# Patient Record
Sex: Male | Born: 1935 | Race: White | Hispanic: No | Marital: Single | Smoking: Former smoker
Health system: Southern US, Community
[De-identification: ages and names within clinical notes are randomized; demographics above are authoritative.]

## PROBLEM LIST (undated history)

## (undated) DIAGNOSIS — H269 Unspecified cataract: Secondary | ICD-10-CM

## (undated) DIAGNOSIS — M549 Dorsalgia, unspecified: Secondary | ICD-10-CM

## (undated) DIAGNOSIS — R300 Dysuria: Secondary | ICD-10-CM

## (undated) DIAGNOSIS — H9190 Unspecified hearing loss, unspecified ear: Secondary | ICD-10-CM

## (undated) DIAGNOSIS — H3530 Unspecified macular degeneration: Secondary | ICD-10-CM

## (undated) DIAGNOSIS — I10 Essential (primary) hypertension: Secondary | ICD-10-CM

## (undated) DIAGNOSIS — L570 Actinic keratosis: Secondary | ICD-10-CM

## (undated) DIAGNOSIS — M4800 Spinal stenosis, site unspecified: Secondary | ICD-10-CM

## (undated) DIAGNOSIS — IMO0001 Reserved for inherently not codable concepts without codable children: Secondary | ICD-10-CM

## (undated) DIAGNOSIS — I6529 Occlusion and stenosis of unspecified carotid artery: Secondary | ICD-10-CM

## (undated) DIAGNOSIS — T8859XA Other complications of anesthesia, initial encounter: Secondary | ICD-10-CM

## (undated) DIAGNOSIS — E7800 Pure hypercholesterolemia, unspecified: Secondary | ICD-10-CM

## (undated) DIAGNOSIS — T4145XA Adverse effect of unspecified anesthetic, initial encounter: Secondary | ICD-10-CM

## (undated) DIAGNOSIS — K219 Gastro-esophageal reflux disease without esophagitis: Secondary | ICD-10-CM

## (undated) DIAGNOSIS — C801 Malignant (primary) neoplasm, unspecified: Secondary | ICD-10-CM

## (undated) HISTORY — DX: Malignant (primary) neoplasm, unspecified: C80.1

## (undated) HISTORY — DX: Dysuria: R30.0

## (undated) HISTORY — PX: CHOLECYSTECTOMY: SHX55

## (undated) HISTORY — DX: Actinic keratosis: L57.0

## (undated) HISTORY — PX: APPENDECTOMY: SHX54

---

## 2000-12-26 ENCOUNTER — Encounter: Admission: RE | Admit: 2000-12-26 | Discharge: 2000-12-26 | Payer: Self-pay | Admitting: *Deleted

## 2000-12-26 ENCOUNTER — Encounter: Payer: Self-pay | Admitting: *Deleted

## 2001-02-02 ENCOUNTER — Ambulatory Visit (HOSPITAL_COMMUNITY): Admission: RE | Admit: 2001-02-02 | Discharge: 2001-02-02 | Payer: Self-pay | Admitting: Gastroenterology

## 2001-02-19 ENCOUNTER — Encounter: Payer: Self-pay | Admitting: Gastroenterology

## 2001-02-19 ENCOUNTER — Ambulatory Visit (HOSPITAL_COMMUNITY): Admission: RE | Admit: 2001-02-19 | Discharge: 2001-02-19 | Payer: Self-pay | Admitting: Gastroenterology

## 2001-11-24 ENCOUNTER — Ambulatory Visit (HOSPITAL_COMMUNITY): Admission: RE | Admit: 2001-11-24 | Discharge: 2001-11-24 | Payer: Self-pay | Admitting: Gastroenterology

## 2001-11-24 ENCOUNTER — Encounter: Payer: Self-pay | Admitting: Gastroenterology

## 2002-03-03 ENCOUNTER — Encounter: Payer: Self-pay | Admitting: *Deleted

## 2002-03-03 ENCOUNTER — Ambulatory Visit (HOSPITAL_COMMUNITY): Admission: RE | Admit: 2002-03-03 | Discharge: 2002-03-03 | Payer: Self-pay | Admitting: *Deleted

## 2003-08-29 ENCOUNTER — Encounter: Admission: RE | Admit: 2003-08-29 | Discharge: 2003-08-29 | Payer: Self-pay | Admitting: Gastroenterology

## 2005-01-08 ENCOUNTER — Ambulatory Visit (HOSPITAL_COMMUNITY): Admission: RE | Admit: 2005-01-08 | Discharge: 2005-01-08 | Payer: Self-pay | Admitting: Gastroenterology

## 2005-01-31 ENCOUNTER — Encounter: Payer: Self-pay | Admitting: Emergency Medicine

## 2005-02-01 ENCOUNTER — Inpatient Hospital Stay (HOSPITAL_COMMUNITY): Admission: EM | Admit: 2005-02-01 | Discharge: 2005-02-02 | Payer: Self-pay | Admitting: Internal Medicine

## 2010-02-25 ENCOUNTER — Encounter: Payer: Self-pay | Admitting: Gastroenterology

## 2010-11-21 DIAGNOSIS — H269 Unspecified cataract: Secondary | ICD-10-CM | POA: Insufficient documentation

## 2011-01-25 ENCOUNTER — Emergency Department (HOSPITAL_COMMUNITY): Payer: Medicare Other

## 2011-01-25 ENCOUNTER — Inpatient Hospital Stay (HOSPITAL_COMMUNITY)
Admission: EM | Admit: 2011-01-25 | Discharge: 2011-01-26 | DRG: 149 | Disposition: A | Discharge status: Home / Self Care | Payer: Medicare Other | Attending: Internal Medicine | Admitting: Internal Medicine

## 2011-01-25 ENCOUNTER — Encounter: Payer: Self-pay | Admitting: *Deleted

## 2011-01-25 DIAGNOSIS — R42 Dizziness and giddiness: Principal | ICD-10-CM | POA: Diagnosis present

## 2011-01-25 DIAGNOSIS — I10 Essential (primary) hypertension: Secondary | ICD-10-CM | POA: Diagnosis present

## 2011-01-25 DIAGNOSIS — E785 Hyperlipidemia, unspecified: Secondary | ICD-10-CM | POA: Diagnosis present

## 2011-01-25 DIAGNOSIS — R110 Nausea: Secondary | ICD-10-CM | POA: Diagnosis present

## 2011-01-25 DIAGNOSIS — I6529 Occlusion and stenosis of unspecified carotid artery: Secondary | ICD-10-CM | POA: Diagnosis present

## 2011-01-25 DIAGNOSIS — I951 Orthostatic hypotension: Secondary | ICD-10-CM

## 2011-01-25 DIAGNOSIS — M4800 Spinal stenosis, site unspecified: Secondary | ICD-10-CM | POA: Diagnosis present

## 2011-01-25 DIAGNOSIS — I6350 Cerebral infarction due to unspecified occlusion or stenosis of unspecified cerebral artery: Secondary | ICD-10-CM

## 2011-01-25 DIAGNOSIS — I658 Occlusion and stenosis of other precerebral arteries: Secondary | ICD-10-CM | POA: Diagnosis present

## 2011-01-25 DIAGNOSIS — K219 Gastro-esophageal reflux disease without esophagitis: Secondary | ICD-10-CM | POA: Diagnosis present

## 2011-01-25 HISTORY — DX: Pure hypercholesterolemia, unspecified: E78.00

## 2011-01-25 HISTORY — DX: Essential (primary) hypertension: I10

## 2011-01-25 HISTORY — DX: Spinal stenosis, site unspecified: M48.00

## 2011-01-25 LAB — URINALYSIS, ROUTINE W REFLEX MICROSCOPIC
Glucose, UA: NEGATIVE mg/dL
Hgb urine dipstick: NEGATIVE
Ketones, ur: NEGATIVE mg/dL
Leukocytes, UA: NEGATIVE
Protein, ur: NEGATIVE mg/dL
pH: 7 (ref 5.0–8.0)

## 2011-01-25 LAB — CARDIAC PANEL(CRET KIN+CKTOT+MB+TROPI)
CK, MB: 3.7 ng/mL (ref 0.3–4.0)
Relative Index: 3.4 — ABNORMAL HIGH (ref 0.0–2.5)
Total CK: 109 U/L (ref 7–232)
Troponin I: <0.3 ng/mL (ref <0.30)

## 2011-01-25 LAB — CBC
MCHC: 34.2 g/dL (ref 30.0–36.0)
Platelets: 190 10*3/uL (ref 150–400)
RDW: 13 % (ref 11.5–15.5)
WBC: 4.8 10*3/uL (ref 4.0–10.5)

## 2011-01-25 LAB — COMPREHENSIVE METABOLIC PANEL
ALT: 15 U/L (ref 0–53)
AST: 23 U/L (ref 0–37)
Albumin: 4.2 g/dL (ref 3.5–5.2)
CO2: 27 mEq/L (ref 19–32)
Chloride: 97 mEq/L (ref 96–112)
GFR calc non Af Amer: 67 mL/min — ABNORMAL LOW (ref >90)
Sodium: 134 mEq/L — ABNORMAL LOW (ref 135–145)
Total Bilirubin: 0.4 mg/dL (ref 0.3–1.2)

## 2011-01-25 LAB — DIFFERENTIAL
Basophils Absolute: 0 10*3/uL (ref 0.0–0.1)
Basophils Relative: 1 % (ref 0–1)
Lymphocytes Relative: 24 % (ref 12–46)
Neutro Abs: 3.2 10*3/uL (ref 1.7–7.7)
Neutrophils Relative %: 66 % (ref 43–77)

## 2011-01-25 LAB — PROTIME-INR: Prothrombin Time: 13.4 seconds (ref 11.6–15.2)

## 2011-01-25 LAB — VITAMIN B12: Vitamin B-12: 430 pg/mL (ref 211–911)

## 2011-01-25 MED ORDER — SENNOSIDES-DOCUSATE SODIUM 8.6-50 MG PO TABS
1.0000 | ORAL_TABLET | Freq: Every evening | ORAL | Status: DC | PRN
  Filled 2011-01-25: qty 1

## 2011-01-25 MED ORDER — ACETAMINOPHEN 650 MG RE SUPP: 650.0000 mg | RECTAL | Status: DC | PRN

## 2011-01-25 MED ORDER — ASPIRIN 300 MG RE SUPP
300.0000 mg | Freq: Every day | RECTAL | Status: DC
  Filled 2011-01-25: qty 1

## 2011-01-25 MED ORDER — SIMVASTATIN 10 MG PO TABS
10.0000 mg | ORAL_TABLET | Freq: Every day | ORAL | Status: DC
  Filled 2011-01-25 (×3): qty 1

## 2011-01-25 MED ORDER — LISINOPRIL 10 MG PO TABS
10.0000 mg | ORAL_TABLET | Freq: Every day | ORAL | Status: DC
  Administered 2011-01-26: 10 mg via ORAL
  Filled 2011-01-25 (×3): qty 1

## 2011-01-25 MED ORDER — PANTOPRAZOLE SODIUM 40 MG PO TBEC
40.0000 mg | DELAYED_RELEASE_TABLET | Freq: Every day | ORAL | Status: DC
  Administered 2011-01-25: 40 mg via ORAL
  Filled 2011-01-25 (×2): qty 1

## 2011-01-25 MED ORDER — ACETAMINOPHEN 325 MG PO TABS
650.0000 mg | ORAL_TABLET | ORAL | Status: DC | PRN
  Administered 2011-01-25 – 2011-01-26 (×2): 650 mg via ORAL
  Filled 2011-01-25 (×2): qty 2

## 2011-01-25 MED ORDER — ONDANSETRON HCL 4 MG/2ML IJ SOLN
4.0000 mg | Freq: Four times a day (QID) | INTRAMUSCULAR | Status: DC | PRN
  Administered 2011-01-25: 4 mg via INTRAVENOUS
  Filled 2011-01-25: qty 2

## 2011-01-25 MED ORDER — ASPIRIN 325 MG PO TABS
325.0000 mg | ORAL_TABLET | Freq: Every day | ORAL | Status: DC
  Administered 2011-01-25 – 2011-01-26 (×2): 325 mg via ORAL
  Filled 2011-01-25 (×2): qty 1

## 2011-01-25 MED ORDER — LOVASTATIN ER 20 MG PO TB24: 20.0000 mg | ORAL_TABLET | Freq: Every day | ORAL | Status: DC

## 2011-01-25 MED ORDER — ONDANSETRON HCL 4 MG/2ML IJ SOLN
4.0000 mg | Freq: Once | INTRAMUSCULAR | Status: AC
  Administered 2011-01-25: 4 mg via INTRAVENOUS
  Filled 2011-01-25: qty 2

## 2011-01-25 MED ORDER — ENOXAPARIN SODIUM 40 MG/0.4ML ~~LOC~~ SOLN
40.0000 mg | SUBCUTANEOUS | Status: DC
  Administered 2011-01-25: 40 mg via SUBCUTANEOUS
  Filled 2011-01-25 (×2): qty 0.4

## 2011-01-25 MED ORDER — SODIUM CHLORIDE 0.9 % IV SOLN: INTRAVENOUS | Status: DC

## 2011-01-25 MED ORDER — SODIUM CHLORIDE 0.9 % IV BOLUS (SEPSIS)
1000.0000 mL | Freq: Once | INTRAVENOUS | Status: AC
  Administered 2011-01-25: 1000 mL via INTRAVENOUS

## 2011-01-25 MED ORDER — SODIUM CHLORIDE 0.9 % IV SOLN
INTRAVENOUS | Status: DC
  Administered 2011-01-25 (×2): via INTRAVENOUS

## 2011-01-25 MED ORDER — SODIUM CHLORIDE 0.9 % IV BOLUS (SEPSIS)
500.0000 mL | Freq: Once | INTRAVENOUS | Status: AC
  Administered 2011-01-25: 500 mL via INTRAVENOUS

## 2011-01-25 MED ORDER — ONDANSETRON HCL 4 MG/2ML IJ SOLN: 4.0000 mg | Freq: Three times a day (TID) | INTRAMUSCULAR | Status: DC | PRN

## 2011-01-25 NOTE — ED Notes (Signed)
Per EMS, pt from home, reports nausea but denies vomiting.  Pt also c/o R leg pain and was given tramadol x 2 yesterday, last dose was lat night.  Has not eaten since 1830.  Started to feel bad this am while walking his dog, became dizzy with nausea.

## 2011-01-25 NOTE — Progress Notes (Signed)
*  PRELIMINARY RESULTS* Carotid duplex completed. Right - Greater than 80% ICA stenosis in the proximal region. Left there is a 40% to 59% ICA stenosis by velocities. Plaque morphology does not support the increase may be due to the stenosis of the contralateral side. Bilateral ECA stenosis. Vertebral arteries are patent and flow is antegrade.  Dominique Ressel, [place] D 01/25/2011, 4:09 PM

## 2011-01-25 NOTE — ED Notes (Signed)
ZOX:[id number]<AV> Expected date:<BR> Expected time:<BR> Means of arrival:Ambulance<BR> Comments:<BR> Dizziness/nausea

## 2011-01-25 NOTE — ED Notes (Signed)
MD at bedside.[facility]   

## 2011-01-25 NOTE — Progress Notes (Signed)
SLP Cancellation Note  Orders received for evaluation.  Pt passed RN swallow screen, and is tolerating po intake.  Please reconsult if needs arise.  Thank you for this referral! Celia B. Bueche, MSP, CCC-SLP [phone]

## 2011-01-25 NOTE — ED Notes (Addendum)
Patient transported to CT 

## 2011-01-25 NOTE — ED Notes (Signed)
Report called to 4 Tressa Busman RN

## 2011-01-25 NOTE — ED Notes (Signed)
Pt's friend Eustaquio Boyden contacted per pt's request.  Pt also spoke to him.  Pt's friend is at bedside.  Pt also requested to have his son contacted, this nurse called and left a message.

## 2011-01-25 NOTE — ED Notes (Signed)
Swallow screen documented that it was completed by mistake

## 2011-01-25 NOTE — ED Provider Notes (Signed)
History     CSN: [id number]  Arrival date & time 01/25/11  4098   First MD Initiated Contact with Patient 01/25/11 [phone]      Chief Complaint  Patient presents with  . Nausea    (Consider location/radiation/quality/duration/timing/severity/associated sxs/prior treatment) HPI Comments: Patient has by EMS with nausea, dizziness and dry heaving started last night. He was recently started on tramadol for spinal stenosis and symptoms started shortly after that. He is a difficult time describing the dizziness but says he feels like he is going to pass out and was unable to walk his dog this morning. He denies any chest pain, shortness of breath, cough or fever.  He said several episodes of vomiting since arrival. He denies any vertiginous type symptoms.  His dizziness is worse with position change.  Prior to last night he has not been ill and states he been eating well.  No abdominal pain.  The history is provided by the patient.    Past Medical History  Diagnosis Date  . Hypertension   . Hypercholesteremia   . Spinal stenosis     Past Surgical History  Procedure Date  . Cholecystectomy approx. 1992    History reviewed. No pertinent family history.  History  Substance Use Topics  . Smoking status: Never Smoker   . Smokeless tobacco: Never Used  . Alcohol Use: 0.6 oz/week    1 Glasses of wine per week     1 glass wine with evening meal      Review of Systems  Constitutional: Positive for activity change, appetite change and fatigue.  HENT: Negative for rhinorrhea.   Eyes: Negative for visual disturbance.  Respiratory: Negative for chest tightness and shortness of breath.   Cardiovascular: Negative for chest pain.  Gastrointestinal: Positive for nausea and vomiting. Negative for abdominal pain.  Genitourinary: Negative for dysuria and hematuria.  Musculoskeletal: Negative for back pain.  Skin: Negative for rash.  Neurological: Positive for dizziness, weakness and  light-headedness. Negative for headaches.    Allergies  Review of patient's allergies indicates no known allergies.  Home Medications   Current Outpatient Rx  Name Route Sig Dispense Refill  . CALCIUM 600 + D PO Oral Take 1 tablet by mouth daily.      Marland Kitchen LISINOPRIL 10 MG PO TABS Oral Take 10 mg by mouth daily.      Marland Kitchen LOVASTATIN ER 20 MG PO TB24 Oral Take 20 mg by mouth at bedtime.      . OMEPRAZOLE 20 MG PO CPDR Oral Take 20 mg by mouth daily.      . TRAMADOL HCL 50 MG PO TBDP Oral Take 50-100 mg by mouth every 6 (six) hours as needed. pain     . VITAMIN E 400 UNITS PO CAPS Oral Take 400 Units by mouth daily.        BP 141/98  Pulse 66  Temp(Src) 98.2 F (36.8 C) (Oral)  Resp 15  SpO2 96%  Physical Exam  Constitutional: He is oriented to person, place, and time. He appears well-developed and well-nourished. No distress.  HENT:  Head: Normocephalic and atraumatic.  Mouth/Throat: Oropharynx is clear and moist. No oropharyngeal exudate.  Eyes: Conjunctivae and EOM are normal. Pupils are equal, round, and reactive to light.  Neck: Normal range of motion.  Cardiovascular: Normal rate, regular rhythm and normal heart sounds.        bradycardic  Pulmonary/Chest: Effort normal and breath sounds normal. No respiratory distress.  Abdominal: Soft. There is  no tenderness. There is no rebound and no guarding.  Musculoskeletal: Normal range of motion. He exhibits no edema and no tenderness.  Neurological: He is alert and oriented to person, place, and time. No cranial nerve deficit.       No facial droop, 5/5 strength throughout, mild ataxia on finger to nose.  No nystagmus, test of skew negative  Skin: Skin is warm.    ED Course  Procedures (including critical care time)  Labs Reviewed  CARDIAC PANEL(CRET KIN+CKTOT+MB+TROPI) - Abnormal; Notable for the following:    Relative Index 3.4 (*)    All other components within normal limits  CBC - Abnormal; Notable for the following:     Hemoglobin 12.7 (*)    HCT 37.1 (*)    All other components within normal limits  COMPREHENSIVE METABOLIC PANEL - Abnormal; Notable for the following:    Sodium 134 (*)    Glucose, Bld 136 (*)    GFR calc non Af Amer 67 (*)    GFR calc Af Amer 77 (*)    All other components within normal limits  DIFFERENTIAL  LIPASE, BLOOD  URINALYSIS, ROUTINE W REFLEX MICROSCOPIC  PROTIME-INR  TSH  VITAMIN B12  RPR   Dg Chest 2 View  01/25/2011  *RADIOLOGY REPORT*  Clinical Data: Dizziness, nausea and vomiting  CHEST - 2 VIEW  Comparison: None.  Findings: The lungs are clear but hyperaerated consistent with COPD.  Mediastinal contours appear normal.  The heart is mildly enlarged.  There are degenerative changes throughout the thoracic spine and the bones are somewhat osteopenic.  IMPRESSION: COPD.  No active lung disease.  Mild cardiomegaly.  Original Report Authenticated By: Juline Patch, M.D.   Ct Head Wo Contrast  01/25/2011  *RADIOLOGY REPORT*  Clinical Data: 77 year old male with dizziness, nausea.  CT HEAD WITHOUT CONTRAST  Technique:  Contiguous axial images were obtained from the base of the skull through the vertex without contrast.  Comparison: None.  Findings: Visualized paranasal sinuses and mastoids are clear. Visualized orbit soft tissues are within normal limits.  Visualized scalp soft tissues are within normal limits.  Are pass bones  Mild hypodensity in the right basal ganglia/deep white matter capsules.  Otherwise normal gray-white matter differentiation throughout the brain.  No ventriculomegaly.  Dural calcifications. No acute intracranial hemorrhage identified.  No evidence of cortically based acute infarction identified.  Calcified atherosclerosis at the skull base.  No suspicious intracranial vascular hyperdensity. No midline shift, mass effect, or evidence of mass lesion.  IMPRESSION: Largely unremarkable for age noncontrast CT appearance of the brain.  No acute intracranial  abnormality.  Original Report Authenticated By: Ulla Potash III, M.D.     1. Orthostatic hypotension   2. Nausea       MDM  Nausea with lightheadedness, vomiting.  Nonfocal neurological exam.  Patient's symptoms may very well be related to his recent introduction of tramadol.  Differential also includes benign positional vertigo, posterior circulation stroke, ACS.  EKG sinus brady without block, enzymes negative.  Positive orthostatics.  Plan admit patient for IV hydration, further workup of his dizziness and presyncope including serial troponins, MRI, carotid Dopplers echocardiogram     Date: 01/25/2011  Rate: 46  Rhythm: sinus bradycardia  QRS Axis: normal  Intervals: QT prolonged  ST/T Wave abnormalities: nonspecific ST/T changes  Conduction Disutrbances:left bundle branch block  Narrative Interpretation: PVCs  Old EKG Reviewed: unchanged      Glynn Octave, MD 01/25/11 1610

## 2011-01-25 NOTE — ED Notes (Signed)
Pt reports dizziness with nausea that started last night, pt reports the dizziness became severe when he was walking his dog this am.  Pt reports starrting tramadol yesterday for his spinal stenosis and leg pain.  Pt is A&Ox 4.  Pt denies any cp or SOB at present.  No vomiting noted-but was dry heaving after moving from the EMS stretcher to the regular stretcher.  Pt is bradycardic-Dr. Rancour aware.  No neuro deficits noted at this itme.

## 2011-01-25 NOTE — ED Notes (Signed)
Patient transported to MRI 

## 2011-01-25 NOTE — H&P (Signed)
PCP:   No primary provider on file.   Chief Complaint:  Dizziness  HPI: This is a 77 y/o gentleman who presents to the ED with dizziness.  Patient noted onset of his symptoms last night.  He went to bed and when he woke up, his dizziness was still present.  He proceeded to take his dog for a walk, but he reported that his dizziness was getting worse, so he presented to the ED for evaluation.  Of note, he has a history of spinal stenosis, and started taking tramadol yesterday.  He feels that his symptoms may be related to the tramadol.  Currently he reports still having continued dizziness and he is unable to ambulate.  He has also had some nausea but no vomiting.  He was seen in the ED, where work up thus far has been negative.  Orthostatics checked and found to be unrevealing.  CT head negative.  Telemetry showed a heart rate that was initially in the 40s but now in the 60s to 70s.  Patient has been referred for admission.  Allergies:  No Known Allergies    Past Medical History  Diagnosis Date  . Hypertension   . Hypercholesteremia   . Spinal stenosis     Past Surgical History  Procedure Date  . Cholecystectomy approx. 1992    Prior to Admission medications   Medication Sig Start Date End Date Taking? Authorizing Provider  Calcium Carbonate-Vitamin D (CALCIUM 600 + D PO) Take 1 tablet by mouth daily.      Historical Provider, MD  lisinopril (PRINIVIL,ZESTRIL) 10 MG tablet Take 10 mg by mouth daily.      Historical Provider, MD  lovastatin (ALTOPREV) 20 MG 24 hr tablet Take 20 mg by mouth at bedtime.      Historical Provider, MD  omeprazole (PRILOSEC) 20 MG capsule Take 20 mg by mouth daily.      Historical Provider, MD  TraMADol HCl 50 MG TBDP Take 50-100 mg by mouth every 6 (six) hours as needed. pain     Historical Provider, MD  vitamin E 400 UNIT capsule Take 400 Units by mouth daily.      Historical Provider, MD    Social History:  reports that he has never smoked. He has  never used smokeless tobacco. He reports that he drinks about .6 ounces of alcohol per week. He reports that he does not use illicit drugs.  History reviewed. No pertinent family history.  Review of Systems: Positives are in bold Constitutional: Denies fever, chills, diaphoresis, appetite change and fatigue.  HEENT: Denies photophobia, eye pain, redness, hearing loss, ear pain, congestion, sore throat, rhinorrhea, sneezing, mouth sores, trouble swallowing, neck pain, neck stiffness and tinnitus.   Respiratory: Denies SOB, DOE, cough, chest tightness,  and wheezing.   Cardiovascular: Denies chest pain, palpitations and leg swelling.  Gastrointestinal: Denies nausea, vomiting, abdominal pain, diarrhea, constipation, blood in stool and abdominal distention.  Genitourinary: Denies dysuria, urgency, frequency, hematuria, flank pain and difficulty urinating.  Musculoskeletal: Denies myalgias, back pain, joint swelling, arthralgias and gait problem.  Skin: Denies pallor, rash and wound.  Neurological: Denies dizziness, seizures, syncope, weakness, light-headedness, numbness and headaches.  Hematological: Denies adenopathy. Easy bruising, personal or family bleeding history  Psychiatric/Behavioral: Denies suicidal ideation, mood changes, confusion, nervousness, sleep disturbance and agitation   Physical Exam: Blood pressure 141/98, pulse 66, temperature 98.7 F (37.1 C), temperature source Oral, resp. rate 15, SpO2 96.00%. GEN: NAD HEENT: [place], AT, PERRLA, MMM NECK: supple CHEST:  CTA B CVS: S1, S@, RRR ABD: soft, NT, ND, BS+ EXT: no edema NEURO: Strength 5/5 in LLE, 4/5 in RLE (chronic due to spinal stenosis), 5/5 in UE b/l, CN grossly intact  Labs on Admission:  Results for orders placed during the hospital encounter of 01/25/11 (from the past 48 hour(s))  CARDIAC PANEL(CRET KIN+CKTOT+MB+TROPI)     Status: Abnormal   Collection Time   01/25/11  8:45 AM      Component Value Range Comment    Total CK 109  7 - 232 (U/L)    CK, MB 3.7  0.3 - 4.0 (ng/mL)    Troponin I <0.30  <0.30 (ng/mL)    Relative Index 3.4 (*) 0.0 - 2.5    CBC     Status: Abnormal   Collection Time   01/25/11  8:45 AM      Component Value Range Comment   WBC 4.8  4.0 - 10.5 (K/uL)    RBC 4.27  4.22 - 5.81 (MIL/uL)    Hemoglobin 12.7 (*) 13.0 - 17.0 (g/dL)    HCT 40.9 (*) 81.1 - 52.0 (%)    MCV 86.9  78.0 - 100.0 (fL)    MCH 29.7  26.0 - 34.0 (pg)    MCHC 34.2  30.0 - 36.0 (g/dL)    RDW 91.4  78.2 - 95.6 (%)    Platelets 190  150 - 400 (K/uL)   DIFFERENTIAL     Status: Normal   Collection Time   01/25/11  8:45 AM      Component Value Range Comment   Neutrophils Relative 66  43 - 77 (%)    Neutro Abs 3.2  1.7 - 7.7 (K/uL)    Lymphocytes Relative 24  12 - 46 (%)    Lymphs Abs 1.2  0.7 - 4.0 (K/uL)    Monocytes Relative 8  3 - 12 (%)    Monocytes Absolute 0.4  0.1 - 1.0 (K/uL)    Eosinophils Relative 1  0 - 5 (%)    Eosinophils Absolute 0.1  0.0 - 0.7 (K/uL)    Basophils Relative 1  0 - 1 (%)    Basophils Absolute 0.0  0.0 - 0.1 (K/uL)   COMPREHENSIVE METABOLIC PANEL     Status: Abnormal   Collection Time   01/25/11  8:45 AM      Component Value Range Comment   Sodium 134 (*) 135 - 145 (mEq/L)    Potassium 4.5  3.5 - 5.1 (mEq/L)    Chloride 97  96 - 112 (mEq/L)    CO2 27  19 - 32 (mEq/L)    Glucose, Bld 136 (*) 70 - 99 (mg/dL)    BUN 17  6 - 23 (mg/dL)    Creatinine, Ser 2.13  0.50 - 1.35 (mg/dL)    Calcium 08.6  8.4 - 10.5 (mg/dL)    Total Protein 7.1  6.0 - 8.3 (g/dL)    Albumin 4.2  3.5 - 5.2 (g/dL)    AST 23  0 - 37 (U/L)    ALT 15  0 - 53 (U/L)    Alkaline Phosphatase 77  39 - 117 (U/L)    Total Bilirubin 0.4  0.3 - 1.2 (mg/dL)    GFR calc non Af Amer 67 (*) >90 (mL/min)    GFR calc Af Amer 77 (*) >90 (mL/min)   LIPASE, BLOOD     Status: Normal   Collection Time   01/25/11  8:45 AM  Component Value Range Comment   Lipase 41  11 - 59 (U/L)   PROTIME-INR     Status: Normal    Collection Time   01/25/11  8:45 AM      Component Value Range Comment   Prothrombin Time 13.4  11.6 - 15.2 (seconds)    INR 1.00  0.00 - 1.49      Radiological Exams on Admission: Dg Chest 2 View  01/25/2011  *RADIOLOGY REPORT*  Clinical Data: Dizziness, nausea and vomiting  CHEST - 2 VIEW  Comparison: None.  Findings: The lungs are clear but hyperaerated consistent with COPD.  Mediastinal contours appear normal.  The heart is mildly enlarged.  There are degenerative changes throughout the thoracic spine and the bones are somewhat osteopenic.  IMPRESSION: COPD.  No active lung disease.  Mild cardiomegaly.  Original Report Authenticated By: Juline Patch, M.D.   Ct Head Wo Contrast  01/25/2011  *RADIOLOGY REPORT*  Clinical Data: 77 year old male with dizziness, nausea.  CT HEAD WITHOUT CONTRAST  Technique:  Contiguous axial images were obtained from the base of the skull through the vertex without contrast.  Comparison: None.  Findings: Visualized paranasal sinuses and mastoids are clear. Visualized orbit soft tissues are within normal limits.  Visualized scalp soft tissues are within normal limits.  Are pass bones  Mild hypodensity in the right basal ganglia/deep white matter capsules.  Otherwise normal gray-white matter differentiation throughout the brain.  No ventriculomegaly.  Dural calcifications. No acute intracranial hemorrhage identified.  No evidence of cortically based acute infarction identified.  Calcified atherosclerosis at the skull base.  No suspicious intracranial vascular hyperdensity. No midline shift, mass effect, or evidence of mass lesion.  IMPRESSION: Largely unremarkable for age noncontrast CT appearance of the brain.  No acute intracranial abnormality.  Original Report Authenticated By: Harley Hallmark, M.D.    Assessment/Plan Principal Problem:  *Vertigo.  This may very well be caused by tramadol.  Other possibilities could be benign positional vertigo.  Of course with his  age, there is always a concern for a posterior circulation CVA.  We will admit to telemetry.  Give aspirin.  Check MRI/MRA brain, 2D echo, carotid dopplers.  Ask PT to see for gait training, hold tramadol and observe.  Will also check TSH, RPR, B12 Active Problems:  We will continue the patient's outpatient medications for his chronic medical issues.  HTN (hypertension)  Spinal stenosis  GERD (gastroesophageal reflux disease)  Hyperlipidemia  Nausea, antiemetics  DNR, confirmed with patient.  Time Spent on Admission:  Chrystel Barefield [facility]  01/25/2011, 12:35 PM

## 2011-01-26 DIAGNOSIS — I6529 Occlusion and stenosis of unspecified carotid artery: Secondary | ICD-10-CM | POA: Diagnosis present

## 2011-01-26 LAB — RPR: RPR Ser Ql: NONREACTIVE

## 2011-01-26 LAB — HEMOGLOBIN A1C
Hgb A1c MFr Bld: 5.8 % — ABNORMAL HIGH (ref <5.7)
Mean Plasma Glucose: 120 mg/dL — ABNORMAL HIGH (ref <117)

## 2011-01-26 LAB — LIPID PANEL
Cholesterol: 154 mg/dL (ref 0–200)
Triglycerides: 57 mg/dL (ref <150)

## 2011-01-26 MED ORDER — ASPIRIN 325 MG PO TABS: 325.0000 mg | ORAL_TABLET | Freq: Every day | ORAL | Status: DC

## 2011-01-26 NOTE — Discharge Summary (Signed)
Physician Discharge Summary  Patient ID: Thomas Mccarty MRN: [id number] DOB/AGE: February 12, 1925 77 y.o.  Admit date: 01/25/2011 Discharge date: 01/26/2011  Primary Care Physician:  Thomas Boys, MD   Discharge Diagnoses:    Principal Problem:  *Vertigo Active Problems:  HTN (hypertension)  Spinal stenosis  GERD (gastroesophageal reflux disease)  Hyperlipidemia  Carotid stenosis    Current Discharge Medication List    START taking these medications   Details  aspirin 325 MG tablet Take 1 tablet (325 mg total) by mouth daily. Qty: 30 tablet, Refills: 1      CONTINUE these medications which have NOT CHANGED   Details  Calcium Carbonate-Vitamin D (CALCIUM 600 + D PO) Take 1 tablet by mouth daily.      lisinopril (PRINIVIL,ZESTRIL) 10 MG tablet Take 10 mg by mouth daily.      lovastatin (ALTOPREV) 20 MG 24 hr tablet Take 20 mg by mouth at bedtime.      omeprazole (PRILOSEC) 20 MG capsule Take 20 mg by mouth daily.      vitamin E 400 UNIT capsule Take 400 Units by mouth daily.        STOP taking these medications     TraMADol HCl 50 MG TBDP        Discharge Exam: Blood pressure 157/69, pulse 51, temperature 97.7 F (36.5 C), temperature source Oral, resp. rate 16, height 6\' 2"  (1.88 m), weight 71.396 kg (157 lb 6.4 oz), SpO2 94.00%. NAD CTA B S1, S2 RRR Soft, NT, ND, BS+ No edema b/l  Disposition and Follow-up:  Follow up with Primary doctor in 1-2 weeks Follow up with Dr. Edilia Mccarty [facility] Surgery in 1-2 weeks  Consults: None   Significant Diagnostic Studies:  Dg Chest 2 View  01/25/2011  *RADIOLOGY REPORT*  Clinical Data: Dizziness, nausea and vomiting  CHEST - 2 VIEW  Comparison: None.  Findings: The lungs are clear but hyperaerated consistent with COPD.  Mediastinal contours appear normal.  The heart is mildly enlarged.  There are degenerative changes throughout the thoracic spine and the bones are somewhat osteopenic.  IMPRESSION: COPD.  No active  lung disease.  Mild cardiomegaly.  Original Report Authenticated By: Juline Patch, M.D.   Ct Head Wo Contrast  01/25/2011  *RADIOLOGY REPORT*  Clinical Data: 77 year old male with dizziness, nausea.  CT HEAD WITHOUT CONTRAST  Technique:  Contiguous axial images were obtained from the base of the skull through the vertex without contrast.  Comparison: None.  Findings: Visualized paranasal sinuses and mastoids are clear. Visualized orbit soft tissues are within normal limits.  Visualized scalp soft tissues are within normal limits.  Are pass bones  Mild hypodensity in the right basal ganglia/deep white matter capsules.  Otherwise normal gray-white matter differentiation throughout the brain.  No ventriculomegaly.  Dural calcifications. No acute intracranial hemorrhage identified.  No evidence of cortically based acute infarction identified.  Calcified atherosclerosis at the skull base.  No suspicious intracranial vascular hyperdensity. No midline shift, mass effect, or evidence of mass lesion.  IMPRESSION: Largely unremarkable for age noncontrast CT appearance of the brain.  No acute intracranial abnormality.  Original Report Authenticated By: Harley Hallmark, M.D.    Brief H and P: For complete details please refer to admission H and P, but in brief This is a 77 y/o gentleman who presents to the ED with dizziness. Patient noted onset of his symptoms last night. He went to bed and when he woke up, his dizziness was still present. He  proceeded to take his dog for a walk, but he reported that his dizziness was getting worse, so he presented to the ED for evaluation. Of note, he has a history of spinal stenosis, and started taking tramadol yesterday. He feels that his symptoms may be related to the tramadol. Currently he reports still having continued dizziness and he is unable to ambulate. He has also had some nausea but no vomiting. He was seen in the ED, where work up thus far has been negative. Orthostatics  checked and found to be unrevealing. CT head negative. Telemetry showed a heart rate that was initially in the 40s but now in the 60s to 70s. Patient has been referred for admission.     Hospital Course:  Principal Problem:  *Vertigo.  Patient was admitted with persistent vertigo.  There was concern for a possible posterior circulation stroke so patient was admitted for further evaluation.  His initial CT head was negative. He was not able to undergo MRI brain due to inability to lie flat due to the curvature in his neck.  Carotid dopplers performed showed a >80% stenosis on the right ICA, 40-59% in the left ICA, and vertebrals were antegrade .  Although this did not explain his dizziness, it was a significant finding.  Patient feels better today and is very adamant about going home.  He does not wish to stay to complete the 2d echo.  Of note, patient was started on tramadol the night before his symptoms started.  Since he has stopped taking it in the hospital, his symptoms have resolved.  It is very likely that his vertigo was an adverse effect of the tramadol. He was advised not to take any further tramadol.  His vertigo is resolved, he is ambulating without difficulty and feels back to normal. Active Problems:  HTN (hypertension)  Spinal stenosis  GERD (gastroesophageal reflux disease)  Hyperlipidemia  Carotid stenosis, discussed with Dr. Edilia Mccarty.  It was recommended that patient follow up with vascular surgery as an outpatient.  He will be given their contact information, and will be continued on a aspirin, he is already on a statin.    Time spent on Discharge:  Signed: Christopherjohn Mccarty [facility]  01/26/2011, 9:27 AM

## 2011-02-08 DIAGNOSIS — I6529 Occlusion and stenosis of unspecified carotid artery: Secondary | ICD-10-CM | POA: Diagnosis not present

## 2011-02-08 DIAGNOSIS — L570 Actinic keratosis: Secondary | ICD-10-CM | POA: Diagnosis not present

## 2011-02-12 DIAGNOSIS — L570 Actinic keratosis: Secondary | ICD-10-CM | POA: Diagnosis not present

## 2011-02-12 DIAGNOSIS — C44621 Squamous cell carcinoma of skin of unspecified upper limb, including shoulder: Secondary | ICD-10-CM | POA: Diagnosis not present

## 2011-02-12 DIAGNOSIS — L820 Inflamed seborrheic keratosis: Secondary | ICD-10-CM | POA: Diagnosis not present

## 2011-02-12 DIAGNOSIS — Z85820 Personal history of malignant melanoma of skin: Secondary | ICD-10-CM | POA: Diagnosis not present

## 2011-02-20 DIAGNOSIS — D3100 Benign neoplasm of unspecified conjunctiva: Secondary | ICD-10-CM | POA: Diagnosis not present

## 2011-03-06 DIAGNOSIS — M47817 Spondylosis without myelopathy or radiculopathy, lumbosacral region: Secondary | ICD-10-CM | POA: Diagnosis not present

## 2011-03-12 ENCOUNTER — Encounter: Payer: Self-pay | Admitting: Vascular Surgery

## 2011-03-13 ENCOUNTER — Encounter: Payer: Self-pay | Admitting: Vascular Surgery

## 2011-03-13 ENCOUNTER — Encounter: Payer: Medicare Other | Admitting: Vascular Surgery

## 2011-03-13 ENCOUNTER — Ambulatory Visit (INDEPENDENT_AMBULATORY_CARE_PROVIDER_SITE_OTHER): Payer: Medicare Other | Admitting: Vascular Surgery

## 2011-03-13 VITALS — BP 170/72 | HR 56 | Resp 16 | Ht 73.0 in | Wt 151.0 lb

## 2011-03-13 DIAGNOSIS — I6529 Occlusion and stenosis of unspecified carotid artery: Secondary | ICD-10-CM | POA: Diagnosis not present

## 2011-03-13 NOTE — Progress Notes (Signed)
[facility] [place]  History and Physical Examination  Patient name: Thomas Mccarty MRN: [id number] DOB: 05/15/1925 Sex: male  REASON FOR CONSULT: greater than 80% right carotid stenosis. Referred by Dr. Fried  HPI: Thomas Mccarty is a 78 y.o. male who states that he had a dizzy spell on Christmas Eve (01/28/2011) and this prompted an evaluation at [facility]. Workup included a carotid duplex scan which showed a greater than 80% right carotid stenosis. Initially the patient did not want to consider surgery, however after discussing this with his primary care physician he is decided to agree to a vascular surgery consultation. The patient is unaware of any previous history of stroke, TIAs, expressive or receptive aphasia, or amaurosis fugax. Of note he is right-handed.  Past Medical History  Diagnosis Date  . Hypertension   . Hypercholesteremia   . Spinal stenosis    He denies any history of myocardial infarction, congestive heart failure, diabetes, or COPD.  History reviewed. No pertinent family history.  SOCIAL HISTORY: History  Substance Use Topics  . Smoking status: Never Smoker   . Smokeless tobacco: Never Used  . Alcohol Use: 0.6 oz/week    1 Glasses of wine per week     1 glass wine with evening meal    No Known Allergies  Current Outpatient Prescriptions  Medication Sig Dispense Refill  . aspirin 325 MG tablet Take 1 tablet (325 mg total) by mouth daily.  30 tablet  1  . Calcium Carbonate-Vitamin D (CALCIUM 600 + D PO) Take 1 tablet by mouth daily.        . lisinopril (PRINIVIL,ZESTRIL) 10 MG tablet Take 10 mg by mouth daily.        . lovastatin (ALTOPREV) 20 MG 24 hr tablet Take 20 mg by mouth at bedtime.        . omeprazole (PRILOSEC) 20 MG capsule Take 20 mg by mouth daily.        . vitamin E 400 UNIT capsule Take 400 Units by mouth daily.          REVIEW OF SYSTEMS: [X ] denotes positive finding; [  ] denotes negative finding    CARDIOVASCULAR:  [ ] chest pain   [ ] chest pressure   [ ] palpitations   [ ] orthopnea   [ ] dyspnea on exertion   [X ] claudication bilat.  [ ] rest pain   [ ] DVT   [ ] phlebitis PULMONARY:   [ ] productive cough   [ ] asthma   [ ] wheezing NEUROLOGIC:   [ ] weakness  [ ] paresthesias  [ ] aphasia  [ ] amaurosis  [ ] dizziness HEMATOLOGIC:   [ ] bleeding problems   [ ] clotting disorders MUSCULOSKELETAL:  [ ] joint pain   [ ] joint swelling [ ] leg swelling GASTROINTESTINAL: [ ]  blood in stool  [ ]  hematemesis GENITOURINARY:  [ ]  dysuria  [ ]  hematuria PSYCHIATRIC:  [ ] history of major depression INTEGUMENTARY:  [ ] rashes  [ ] ulcers CONSTITUTIONAL:  [ ] fever   [ ] chills  PHYSICAL EXAM: Filed Vitals:   03/13/11 1506  BP: 170/72  Pulse: 56  Resp: 16  Height: 6' 1" (1.854 m)  Weight: 151 lb (68.493 kg)  SpO2: 98%   Body mass index is 19.92 kg/(m^2). GENERAL: The patient is a well-nourished male, in no acute distress. The vital   signs are documented above. CARDIOVASCULAR: There is a regular rate and rhythm without significant murmur appreciated. He has a left carotid bruit. He has palpable femoral pulses and adequately perfused feet without ischemic ulcers. PULMONARY: There is good air exchange bilaterally without wheezing or rales. ABDOMEN: Soft and non-tender with normal pitched bowel sounds. I cannot palpate an abdominal aortic aneurysm. MUSCULOSKELETAL: There are no major deformities or cyanosis. He has a reasonable neck mobility. NEUROLOGIC: No focal weakness or paresthesias are detected. SKIN: There are no ulcers or rashes noted. PSYCHIATRIC: The patient has a normal affect.  DATA:   Lab Results  Component Value Date   NA 134* 01/25/2011   K 4.5 01/25/2011   CL 97 01/25/2011   CO2 27 01/25/2011   Lab Results  Component Value Date   CREATININE 1.01 01/25/2011   Lab Results  Component Value Date   INR 1.00 01/25/2011   I have independently interpreted his  carotid duplex scan which shows a greater than 80% right carotid stenosis with a 40-59% left carotid stenosis. Both vertebral arteries are patent with normally directed flow.  CT scan which was done when he was being worked up for dizziness showed no acute intracranial abnormalities.  I reviewed his records from [facility] his cholesterol is being closely monitored in he otherwise appears to be in very good shape for his age.  MEDICAL ISSUES:  Carotid stenosis This patient has a greater than 80% right carotid stenosis. End diastolic velocity is 131 cm/s and peak systolic velocity is 558 cm/s suggesting a critical stenosis. He is asymptomatic. However, given the severity of the stenosis I have recommended right carotid endarterectomy in order to lower his risk of future stroke. I have reviewed the indications for carotid endarterectomy, that is to lower the risk of future stroke. I have also reviewed the potential complications of surgery, including but not limited to: bleeding, stroke (perioperative risk 1-2%), MI, nerve injury of other unpredictable medical problems. All of the patients questions were answered and they are agreeable to proceed with surgery. He has no significant cardiac history and he is had no recent cardiac symptoms. He is agreeable to proceed in his surgery scheduled for 03/19/2011. He knows to continue his aspirin right up through surgery.      Raschelle Wisenbaker S [facility] [place] Beeper: [phone]    

## 2011-03-13 NOTE — Assessment & Plan Note (Signed)
This patient has a greater than 80% right carotid stenosis. End diastolic velocity is 131 cm/s and peak systolic velocity is 558 cm/s suggesting a critical stenosis. He is asymptomatic. However, given the severity of the stenosis I have recommended right carotid endarterectomy in order to lower his risk of future stroke. I have reviewed the indications for carotid endarterectomy, that is to lower the risk of future stroke. I have also reviewed the potential complications of surgery, including but not limited to: bleeding, stroke (perioperative risk 1-2%), MI, nerve injury of other unpredictable medical problems. All of the patients questions were answered and they are agreeable to proceed with surgery. He has no significant cardiac history and he is had no recent cardiac symptoms. He is agreeable to proceed in his surgery scheduled for 03/19/2011. He knows to continue his aspirin right up through surgery.

## 2011-03-14 ENCOUNTER — Encounter (HOSPITAL_COMMUNITY): Payer: Self-pay | Admitting: Pharmacy Technician

## 2011-03-14 ENCOUNTER — Other Ambulatory Visit: Payer: Self-pay

## 2011-03-18 ENCOUNTER — Encounter (HOSPITAL_COMMUNITY)
Admission: RE | Admit: 2011-03-18 | Discharge: 2011-03-18 | Disposition: A | Discharge status: Home / Self Care | Payer: Medicare Other | Source: Ambulatory Visit | Attending: Vascular Surgery | Admitting: Vascular Surgery

## 2011-03-18 ENCOUNTER — Encounter (HOSPITAL_COMMUNITY): Payer: Self-pay

## 2011-03-18 HISTORY — DX: Unspecified hearing loss, unspecified ear: H91.90

## 2011-03-18 HISTORY — DX: Unspecified cataract: H26.9

## 2011-03-18 HISTORY — DX: Occlusion and stenosis of unspecified carotid artery: I65.29

## 2011-03-18 HISTORY — DX: Reserved for inherently not codable concepts without codable children: IMO0001

## 2011-03-18 HISTORY — DX: Gastro-esophageal reflux disease without esophagitis: K21.9

## 2011-03-18 HISTORY — DX: Unspecified macular degeneration: H35.30

## 2011-03-18 HISTORY — DX: Dorsalgia, unspecified: M54.9

## 2011-03-18 LAB — COMPREHENSIVE METABOLIC PANEL
Alkaline Phosphatase: 72 U/L (ref 39–117)
BUN: 19 mg/dL (ref 6–23)
CO2: 30 mEq/L (ref 19–32)
Chloride: 100 mEq/L (ref 96–112)
Creatinine, Ser: 0.92 mg/dL (ref 0.50–1.35)
GFR calc non Af Amer: 76 mL/min — ABNORMAL LOW (ref >90)
Glucose, Bld: 91 mg/dL (ref 70–99)
Total Bilirubin: 0.4 mg/dL (ref 0.3–1.2)

## 2011-03-18 LAB — PROTIME-INR: INR: 1 (ref 0.00–1.49)

## 2011-03-18 LAB — DIFFERENTIAL
Basophils Relative: 1 % (ref 0–1)
Lymphs Abs: 1.4 10*3/uL (ref 0.7–4.0)
Monocytes Absolute: 0.5 10*3/uL (ref 0.1–1.0)
Monocytes Relative: 9 % (ref 3–12)
Neutro Abs: 3.3 10*3/uL (ref 1.7–7.7)

## 2011-03-18 LAB — TYPE AND SCREEN
ABO/RH(D): A POS
Antibody Screen: NEGATIVE

## 2011-03-18 LAB — URINALYSIS, ROUTINE W REFLEX MICROSCOPIC
Bilirubin Urine: NEGATIVE
Glucose, UA: NEGATIVE mg/dL
Hgb urine dipstick: NEGATIVE
Protein, ur: NEGATIVE mg/dL
Specific Gravity, Urine: 1.009 (ref 1.005–1.030)
Urobilinogen, UA: 0.2 mg/dL (ref 0.0–1.0)

## 2011-03-18 LAB — CBC
HCT: 41.2 % (ref 39.0–52.0)
Hemoglobin: 13.9 g/dL (ref 13.0–17.0)
MCV: 87.8 fL (ref 78.0–100.0)
RBC: 4.69 MIL/uL (ref 4.22–5.81)
WBC: 5.2 10*3/uL (ref 4.0–10.5)

## 2011-03-18 MED ORDER — DEXTROSE 5 % IV SOLN
1.5000 g | INTRAVENOUS | Status: DC
  Filled 2011-03-18: qty 1.5

## 2011-03-18 MED ORDER — SODIUM CHLORIDE 0.9 % IV SOLN
INTRAVENOUS | Status: DC
  Administered 2011-03-19: 500 mL via INTRAVENOUS

## 2011-03-18 NOTE — Consult Note (Signed)
Anesthesia:  Patient is a 78 year old male scheduled for right CEA on 03/19/11 for > 80% stenosis found during work-up for dizziness.  His PAT visit was today.  History includes hypercholesterolemia, HTN, spinal stenosis, GERD, cataracts, macular degeneration, impaired hearing, non-smoker.  PCP is Dr. Foy Guadalajara.  EKG from 01/25/11 shows SB with first degree AVB, occasional PVC, and left BBB.  This EKG was found under the Media tab only and does not list his DOB or other identifiers except name.  I am going to have it repeated on the day of surgery.  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  He denied ever having any sort of cardiac work-up.  He also did not report any CV symptoms.  His HR was 60 bpm today.    CXR from 01/25/11 shows COPD, mild CM, no acute disease.  Labs noted.  K is 5.6.  Will check an ISTAT4 on the day of surgery.   I reviewed his medical history and history of left BBB since 2004 without formal cardiac evaluation with Anesthesiologist Dr. Krista Blue.  His Anesthesiologist will evaluate him on the day of surgery, but anticipate that if no worrisome symptoms, if follow-up EKG is stable, and K is reasonable, that Thomas Mccarty could proceed.  I updated [name], RN at [facility].

## 2011-03-18 NOTE — Pre-Procedure Instructions (Signed)
20 Thomas Mccarty  03/18/2011   Your procedure is scheduled on:  Tues, Feb 12 @ 0730  Report to [facility] at 0530 AM.  Call this number if you have problems the morning of surgery: [phone]   Remember:   Do not eat food:After Midnight.  May have clear liquids: up to 4 Hours before arrival.(until 1:30 am)  Clear liquids include soda, tea, black coffee, apple or grape juice, broth.  Take these medicines the morning of surgery with A SIP OF WATER: Omeprazole   Do not wear jewelry, make-up or nail polish.  Do not wear lotions, powders, or perfumes. You may wear deodorant.  Do not shave 48 hours prior to surgery.  Do not bring valuables to the hospital.  Contacts, dentures or bridgework may not be worn into surgery.  Leave suitcase in the car. After surgery it may be brought to your room.  For patients admitted to the hospital, checkout time is 11:00 AM the day of discharge.   Patients discharged the day of surgery will not be allowed to drive home.  Name and phone number of your driver:   Special Instructions: CHG Shower Use Special Wash: 1/2 bottle night before surgery and 1/2 bottle morning of surgery.   Please read over the following fact sheets that you were given: Pain Booklet, Coughing and Deep Breathing, Blood Transfusion Information, MRSA Information and Surgical Site Infection Prevention

## 2011-03-18 NOTE — Progress Notes (Signed)
Dr.Fried manages HTN/hyperlipidemia Pt doesn't have a cardiologist  Pt has never had a heart cath/echo/stress test

## 2011-03-19 ENCOUNTER — Inpatient Hospital Stay (HOSPITAL_COMMUNITY)
Admission: RE | Admit: 2011-03-19 | Discharge: 2011-03-21 | DRG: 039 | Disposition: A | Discharge status: Home / Self Care | Payer: Medicare Other | Source: Ambulatory Visit | Attending: Vascular Surgery | Admitting: Vascular Surgery

## 2011-03-19 ENCOUNTER — Encounter (HOSPITAL_COMMUNITY): Payer: Self-pay | Admitting: Vascular Surgery

## 2011-03-19 ENCOUNTER — Encounter (HOSPITAL_COMMUNITY): Payer: Self-pay | Admitting: *Deleted

## 2011-03-19 ENCOUNTER — Other Ambulatory Visit: Payer: Self-pay | Admitting: Vascular Surgery

## 2011-03-19 ENCOUNTER — Other Ambulatory Visit: Payer: Self-pay

## 2011-03-19 ENCOUNTER — Ambulatory Visit (HOSPITAL_COMMUNITY): Payer: Medicare Other | Admitting: Vascular Surgery

## 2011-03-19 ENCOUNTER — Encounter (HOSPITAL_COMMUNITY)
Admission: RE | Disposition: A | Discharge status: Home / Self Care | Payer: Self-pay | Source: Ambulatory Visit | Attending: Vascular Surgery

## 2011-03-19 DIAGNOSIS — Z7982 Long term (current) use of aspirin: Secondary | ICD-10-CM

## 2011-03-19 DIAGNOSIS — H9190 Unspecified hearing loss, unspecified ear: Secondary | ICD-10-CM | POA: Diagnosis present

## 2011-03-19 DIAGNOSIS — E7800 Pure hypercholesterolemia, unspecified: Secondary | ICD-10-CM | POA: Diagnosis not present

## 2011-03-19 DIAGNOSIS — I10 Essential (primary) hypertension: Secondary | ICD-10-CM | POA: Diagnosis present

## 2011-03-19 DIAGNOSIS — H353190 Nonexudative age-related macular degeneration, unspecified eye, stage unspecified: Secondary | ICD-10-CM | POA: Diagnosis present

## 2011-03-19 DIAGNOSIS — Z23 Encounter for immunization: Secondary | ICD-10-CM | POA: Diagnosis not present

## 2011-03-19 DIAGNOSIS — K219 Gastro-esophageal reflux disease without esophagitis: Secondary | ICD-10-CM | POA: Diagnosis not present

## 2011-03-19 DIAGNOSIS — H269 Unspecified cataract: Secondary | ICD-10-CM | POA: Diagnosis present

## 2011-03-19 DIAGNOSIS — M545 Low back pain, unspecified: Secondary | ICD-10-CM | POA: Diagnosis not present

## 2011-03-19 DIAGNOSIS — I6529 Occlusion and stenosis of unspecified carotid artery: Secondary | ICD-10-CM | POA: Diagnosis not present

## 2011-03-19 DIAGNOSIS — Z79899 Other long term (current) drug therapy: Secondary | ICD-10-CM | POA: Diagnosis not present

## 2011-03-19 DIAGNOSIS — Z01812 Encounter for preprocedural laboratory examination: Secondary | ICD-10-CM | POA: Diagnosis not present

## 2011-03-19 HISTORY — PX: CAROTID ENDARTERECTOMY: SUR193

## 2011-03-19 HISTORY — PX: ENDARTERECTOMY: SHX5162

## 2011-03-19 LAB — CBC
HCT: 33.3 % — ABNORMAL LOW (ref 39.0–52.0)
MCHC: 34.5 g/dL (ref 30.0–36.0)
Platelets: 146 10*3/uL — ABNORMAL LOW (ref 150–400)
RDW: 13 % (ref 11.5–15.5)
WBC: 6.5 10*3/uL (ref 4.0–10.5)

## 2011-03-19 LAB — CREATININE, SERUM
Creatinine, Ser: 0.86 mg/dL (ref 0.50–1.35)
GFR calc Af Amer: >90 mL/min (ref >90)
GFR calc non Af Amer: 78 mL/min — ABNORMAL LOW (ref >90)

## 2011-03-19 LAB — POCT I-STAT 4, (NA,K, GLUC, HGB,HCT)
Glucose, Bld: 105 mg/dL — ABNORMAL HIGH (ref 70–99)
Hemoglobin: 13.3 g/dL (ref 13.0–17.0)
Potassium: 5.3 mEq/L — ABNORMAL HIGH (ref 3.5–5.1)

## 2011-03-19 SURGERY — ENDARTERECTOMY, CAROTID
Anesthesia: General | Site: Neck | Laterality: Right | Wound class: Clean

## 2011-03-19 MED ORDER — LOVASTATIN ER 20 MG PO TB24: 20.0000 mg | ORAL_TABLET | Freq: Every day | ORAL | Status: DC

## 2011-03-19 MED ORDER — ROCURONIUM BROMIDE 100 MG/10ML IV SOLN
INTRAVENOUS | Status: DC | PRN
  Administered 2011-03-19: 50 mg via INTRAVENOUS

## 2011-03-19 MED ORDER — ONDANSETRON HCL 4 MG/2ML IJ SOLN: 4.0000 mg | Freq: Once | INTRAMUSCULAR | Status: DC | PRN

## 2011-03-19 MED ORDER — DEXTRAN 40 IN SALINE 10-0.9 % IV SOLN
INTRAVENOUS | Status: DC | PRN
  Administered 2011-03-19: 130 mL

## 2011-03-19 MED ORDER — MAGNESIUM SULFATE 40 MG/ML IJ SOLN
2.0000 g | Freq: Once | INTRAMUSCULAR | Status: AC | PRN
  Filled 2011-03-19: qty 50

## 2011-03-19 MED ORDER — ASPIRIN 325 MG PO TABS
325.0000 mg | ORAL_TABLET | Freq: Every day | ORAL | Status: DC
  Administered 2011-03-19 – 2011-03-20 (×2): 325 mg via ORAL
  Filled 2011-03-19 (×3): qty 1

## 2011-03-19 MED ORDER — PNEUMOCOCCAL VAC POLYVALENT 25 MCG/0.5ML IJ INJ
0.5000 mL | INJECTION | INTRAMUSCULAR | Status: AC
  Administered 2011-03-20: 0.5 mL via INTRAMUSCULAR
  Filled 2011-03-19 (×2): qty 0.5

## 2011-03-19 MED ORDER — ENOXAPARIN SODIUM 40 MG/0.4ML ~~LOC~~ SOLN
40.0000 mg | SUBCUTANEOUS | Status: DC
  Administered 2011-03-19: 40 mg via SUBCUTANEOUS
  Filled 2011-03-19 (×3): qty 0.4

## 2011-03-19 MED ORDER — LACTATED RINGERS IV SOLN
INTRAVENOUS | Status: DC | PRN
  Administered 2011-03-19 (×2): via INTRAVENOUS

## 2011-03-19 MED ORDER — SODIUM CHLORIDE 0.9 % IR SOLN
Status: DC | PRN
  Administered 2011-03-19: 08:00:00

## 2011-03-19 MED ORDER — PROTAMINE SULFATE 10 MG/ML IV SOLN
INTRAVENOUS | Status: DC | PRN
  Administered 2011-03-19: 30 mg via INTRAVENOUS

## 2011-03-19 MED ORDER — DEXTROSE 5 % IV SOLN
1.5000 g | Freq: Two times a day (BID) | INTRAVENOUS | Status: AC
  Administered 2011-03-19: 1.5 g via INTRAVENOUS
  Filled 2011-03-19 (×3): qty 1.5

## 2011-03-19 MED ORDER — VECURONIUM BROMIDE 10 MG IV SOLR
INTRAVENOUS | Status: DC | PRN
  Administered 2011-03-19: 3 mg via INTRAVENOUS

## 2011-03-19 MED ORDER — LISINOPRIL 10 MG PO TABS
10.0000 mg | ORAL_TABLET | Freq: Every day | ORAL | Status: DC
  Administered 2011-03-19: 10 mg via ORAL
  Filled 2011-03-19 (×2): qty 1

## 2011-03-19 MED ORDER — PHENOL 1.4 % MT LIQD: 1.0000 | OROMUCOSAL | Status: DC | PRN

## 2011-03-19 MED ORDER — FENTANYL CITRATE 0.05 MG/ML IJ SOLN
INTRAMUSCULAR | Status: DC | PRN
  Administered 2011-03-19: 25 ug via INTRAVENOUS
  Administered 2011-03-19: 50 ug via INTRAVENOUS
  Administered 2011-03-19: 100 ug via INTRAVENOUS
  Administered 2011-03-19: 25 ug via INTRAVENOUS

## 2011-03-19 MED ORDER — LABETALOL HCL 5 MG/ML IV SOLN: 10.0000 mg | INTRAVENOUS | Status: DC | PRN

## 2011-03-19 MED ORDER — ALUM & MAG HYDROXIDE-SIMETH 400-400-40 MG/5ML PO SUSP
15.0000 mL | ORAL | Status: DC | PRN
  Filled 2011-03-19 (×2): qty 30

## 2011-03-19 MED ORDER — GUAIFENESIN-DM 100-10 MG/5ML PO SYRP: 15.0000 mL | ORAL_SOLUTION | ORAL | Status: DC | PRN

## 2011-03-19 MED ORDER — DIAZEPAM 5 MG PO TABS
5.0000 mg | ORAL_TABLET | Freq: Four times a day (QID) | ORAL | Status: DC | PRN
  Administered 2011-03-19 – 2011-03-20 (×2): 5 mg via ORAL
  Filled 2011-03-19 (×3): qty 1

## 2011-03-19 MED ORDER — 0.9 % SODIUM CHLORIDE (POUR BTL) OPTIME
TOPICAL | Status: DC | PRN
  Administered 2011-03-19: 1000 mL

## 2011-03-19 MED ORDER — SODIUM CHLORIDE 0.9 % IV SOLN
INTRAVENOUS | Status: DC
  Administered 2011-03-19: 75 mL/h via INTRAVENOUS

## 2011-03-19 MED ORDER — HYDROMORPHONE HCL PF 1 MG/ML IJ SOLN: 0.2500 mg | INTRAMUSCULAR | Status: DC | PRN

## 2011-03-19 MED ORDER — MORPHINE SULFATE 2 MG/ML IJ SOLN: 0.0500 mg/kg | INTRAMUSCULAR | Status: DC | PRN

## 2011-03-19 MED ORDER — NEOSTIGMINE METHYLSULFATE 1 MG/ML IJ SOLN
INTRAMUSCULAR | Status: DC | PRN
  Administered 2011-03-19: 5 mg via INTRAVENOUS

## 2011-03-19 MED ORDER — GLYCOPYRROLATE 0.2 MG/ML IJ SOLN
INTRAMUSCULAR | Status: DC | PRN
  Administered 2011-03-19: 1 mg via INTRAVENOUS

## 2011-03-19 MED ORDER — INFLUENZA VIRUS VACC SPLIT PF IM SUSP
0.5000 mL | INTRAMUSCULAR | Status: AC
  Administered 2011-03-20: 0.5 mL via INTRAMUSCULAR
  Filled 2011-03-19 (×2): qty 0.5

## 2011-03-19 MED ORDER — PROPOFOL 10 MG/ML IV EMUL
INTRAVENOUS | Status: DC | PRN
  Administered 2011-03-19: 200 mg via INTRAVENOUS

## 2011-03-19 MED ORDER — ONDANSETRON HCL 4 MG/2ML IJ SOLN: 4.0000 mg | Freq: Four times a day (QID) | INTRAMUSCULAR | Status: DC | PRN

## 2011-03-19 MED ORDER — SIMVASTATIN 10 MG PO TABS
10.0000 mg | ORAL_TABLET | Freq: Every day | ORAL | Status: DC
  Administered 2011-03-19 – 2011-03-20 (×2): 10 mg via ORAL
  Filled 2011-03-19 (×3): qty 1

## 2011-03-19 MED ORDER — METOPROLOL TARTRATE 1 MG/ML IV SOLN: 2.0000 mg | INTRAVENOUS | Status: DC | PRN

## 2011-03-19 MED ORDER — SODIUM CHLORIDE 0.9 % IV SOLN: 500.0000 mL | Freq: Once | INTRAVENOUS | Status: AC | PRN

## 2011-03-19 MED ORDER — DOPAMINE-DEXTROSE 3.2-5 MG/ML-% IV SOLN
3.0000 ug/kg/min | INTRAVENOUS | Status: DC
  Filled 2011-03-19: qty 250

## 2011-03-19 MED ORDER — ACETAMINOPHEN 650 MG RE SUPP: 325.0000 mg | RECTAL | Status: DC | PRN

## 2011-03-19 MED ORDER — LIDOCAINE-EPINEPHRINE (PF) 1 %-1:200000 IJ SOLN
INTRAMUSCULAR | Status: DC | PRN
  Administered 2011-03-19: 10 mL

## 2011-03-19 MED ORDER — DEXTRAN 40 IN SALINE 10-0.9 % IV SOLN
25.0000 mL/h | INTRAVENOUS | Status: AC
  Filled 2011-03-19: qty 500

## 2011-03-19 MED ORDER — PANTOPRAZOLE SODIUM 40 MG PO TBEC
40.0000 mg | DELAYED_RELEASE_TABLET | Freq: Every day | ORAL | Status: DC
  Administered 2011-03-19 – 2011-03-20 (×2): 40 mg via ORAL
  Filled 2011-03-19 (×2): qty 1

## 2011-03-19 MED ORDER — OXYCODONE-ACETAMINOPHEN 5-325 MG PO TABS: 2.0000 | ORAL_TABLET | ORAL | Status: DC | PRN

## 2011-03-19 MED ORDER — VITAMIN E 180 MG (400 UNIT) PO CAPS
400.0000 [IU] | ORAL_CAPSULE | Freq: Every day | ORAL | Status: DC
  Administered 2011-03-19 – 2011-03-20 (×2): 400 [IU] via ORAL
  Filled 2011-03-19 (×3): qty 1

## 2011-03-19 MED ORDER — POTASSIUM CHLORIDE CRYS ER 20 MEQ PO TBCR: 20.0000 meq | EXTENDED_RELEASE_TABLET | Freq: Once | ORAL | Status: AC | PRN

## 2011-03-19 MED ORDER — ACETAMINOPHEN 325 MG PO TABS
325.0000 mg | ORAL_TABLET | ORAL | Status: DC | PRN
  Administered 2011-03-19: 650 mg via ORAL
  Filled 2011-03-19 (×2): qty 2

## 2011-03-19 MED ORDER — ASPIRIN EC 325 MG PO TBEC: 325.0000 mg | DELAYED_RELEASE_TABLET | Freq: Every day | ORAL | Status: DC

## 2011-03-19 MED ORDER — WHITE PETROLATUM GEL
Status: AC
  Administered 2011-03-19: 17:00:00
  Filled 2011-03-19: qty 5

## 2011-03-19 MED ORDER — MEPERIDINE HCL 25 MG/ML IJ SOLN: 6.2500 mg | INTRAMUSCULAR | Status: DC | PRN

## 2011-03-19 MED ORDER — DOCUSATE SODIUM 100 MG PO CAPS
100.0000 mg | ORAL_CAPSULE | Freq: Every day | ORAL | Status: DC
  Administered 2011-03-20: 100 mg via ORAL
  Filled 2011-03-19: qty 1

## 2011-03-19 MED ORDER — HYDRALAZINE HCL 20 MG/ML IJ SOLN
10.0000 mg | INTRAMUSCULAR | Status: DC | PRN
  Filled 2011-03-19: qty 0.5

## 2011-03-19 MED ORDER — FAMOTIDINE IN NACL 20-0.9 MG/50ML-% IV SOLN
20.0000 mg | Freq: Two times a day (BID) | INTRAVENOUS | Status: DC
  Administered 2011-03-19 – 2011-03-20 (×3): 20 mg via INTRAVENOUS
  Filled 2011-03-19 (×5): qty 50

## 2011-03-19 MED ORDER — ONDANSETRON HCL 4 MG/2ML IJ SOLN
INTRAMUSCULAR | Status: DC | PRN
  Administered 2011-03-19: 4 mg via INTRAVENOUS

## 2011-03-19 SURGICAL SUPPLY — 54 items
ADH SKN CLS APL DERMABOND .7 (GAUZE/BANDAGES/DRESSINGS) ×1
ADH SKN CLS LQ APL DERMABOND (GAUZE/BANDAGES/DRESSINGS) ×1
BAG DECANTER FOR FLEXI CONT (MISCELLANEOUS) ×2 IMPLANT
CANISTER SUCTION 2500CC (MISCELLANEOUS) ×2 IMPLANT
CANNULA VESSEL W/WING WO/VALVE (CANNULA) ×2 IMPLANT
CATH ROBINSON RED A/P 18FR (CATHETERS) ×2 IMPLANT
CLIP TI MEDIUM 24 (CLIP) ×2 IMPLANT
CLIP TI WIDE RED SMALL 24 (CLIP) ×2 IMPLANT
CLOTH BEACON ORANGE TIMEOUT ST (SAFETY) ×2 IMPLANT
COVER SURGICAL LIGHT HANDLE (MISCELLANEOUS) ×4 IMPLANT
CRADLE DONUT ADULT HEAD (MISCELLANEOUS) ×2 IMPLANT
DERMABOND ADHESIVE PROPEN (GAUZE/BANDAGES/DRESSINGS) ×1
DERMABOND ADVANCED (GAUZE/BANDAGES/DRESSINGS) ×1
DERMABOND ADVANCED .7 DNX12 (GAUZE/BANDAGES/DRESSINGS) ×1 IMPLANT
DERMABOND ADVANCED .7 DNX6 (GAUZE/BANDAGES/DRESSINGS) IMPLANT
DRAIN CHANNEL 15F RND FF W/TCR (WOUND CARE) IMPLANT
DRAPE WARM FLUID 44X44 (DRAPE) ×2 IMPLANT
ELECT REM PT RETURN 9FT ADLT (ELECTROSURGICAL) ×2
ELECTRODE REM PT RTRN 9FT ADLT (ELECTROSURGICAL) ×1 IMPLANT
EVACUATOR SILICONE 100CC (DRAIN) IMPLANT
GLOVE BIO SURGEON STRL SZ7 (GLOVE) ×1 IMPLANT
GLOVE BIO SURGEON STRL SZ7.5 (GLOVE) ×2 IMPLANT
GLOVE BIOGEL PI IND STRL 6.5 (GLOVE) IMPLANT
GLOVE BIOGEL PI IND STRL 7.0 (GLOVE) IMPLANT
GLOVE BIOGEL PI IND STRL 7.5 (GLOVE) ×1 IMPLANT
GLOVE BIOGEL PI INDICATOR 6.5 (GLOVE) ×1
GLOVE BIOGEL PI INDICATOR 7.0 (GLOVE) ×2
GLOVE BIOGEL PI INDICATOR 7.5 (GLOVE) ×4
GLOVE ECLIPSE 6.5 STRL STRAW (GLOVE) ×1 IMPLANT
GLOVE SURG SS PI 7.5 STRL IVOR (GLOVE) ×2 IMPLANT
GOWN PREVENTION PLUS XLARGE (GOWN DISPOSABLE) ×2 IMPLANT
GOWN STRL NON-REIN LRG LVL3 (GOWN DISPOSABLE) ×6 IMPLANT
KIT BASIN OR (CUSTOM PROCEDURE TRAY) ×2 IMPLANT
KIT ROOM TURNOVER OR (KITS) ×2 IMPLANT
NDL HYPO 25X1 1.5 SAFETY (NEEDLE) ×1 IMPLANT
NEEDLE HYPO 25X1 1.5 SAFETY (NEEDLE) ×2 IMPLANT
NS IRRIG 1000ML POUR BTL (IV SOLUTION) ×4 IMPLANT
PACK CAROTID (CUSTOM PROCEDURE TRAY) ×2 IMPLANT
PAD ARMBOARD 7.5X6 YLW CONV (MISCELLANEOUS) ×4 IMPLANT
SHUNT CAROTID BYPASS 10 (VASCULAR PRODUCTS) IMPLANT
SHUNT CAROTID BYPASS 12FRX15.5 (VASCULAR PRODUCTS) ×1 IMPLANT
SPECIMEN JAR SMALL (MISCELLANEOUS) ×2 IMPLANT
SPONGE SURGIFOAM ABS GEL 100 (HEMOSTASIS) IMPLANT
SUT PROLENE 6 0 BV (SUTURE) ×3 IMPLANT
SUT PROLENE 7 0 BV 1 (SUTURE) IMPLANT
SUT SILK 2 0 FS (SUTURE) IMPLANT
SUT VIC AB 3-0 SH 27 (SUTURE) ×2
SUT VIC AB 3-0 SH 27X BRD (SUTURE) ×1 IMPLANT
SUT VICRYL 4-0 PS2 18IN ABS (SUTURE) ×2 IMPLANT
SYR CONTROL 10ML LL (SYRINGE) ×2 IMPLANT
TOWEL OR 17X24 6PK STRL BLUE (TOWEL DISPOSABLE) ×2 IMPLANT
TOWEL OR 17X26 10 PK STRL BLUE (TOWEL DISPOSABLE) ×2 IMPLANT
TRAY FOLEY CATH 14FRSI W/METER (CATHETERS) ×2 IMPLANT
WATER STERILE IRR 1000ML POUR (IV SOLUTION) ×2 IMPLANT

## 2011-03-19 NOTE — Interval H&P Note (Signed)
History and Physical Interval Note:  03/19/2011 7:23 AM  Thomas Mccarty  has presented today for surgery, with the diagnosis of Right Internal Carotid Artery Stenosis  The various methods of treatment have been discussed with the patient and family. After consideration of risks, benefits and other options for treatment, the patient has consented to: RIGHT CAROTID ENDARTERECTOMY. The patients' history has been reviewed, patient examined, no change in status, stable for surgery.  I have reviewed the patients' chart and labs.  Questions were answered to the patient's satisfaction.     Reham Slabaugh S

## 2011-03-19 NOTE — Progress Notes (Signed)
VASCULAR PROGRESS NOTE  SUBJECTIVE: Comfortable. No complaints.  PHYSICAL EXAM: Filed Vitals:   03/19/11 1045 03/19/11 1100 03/19/11 1130 03/19/11 1230  BP: 101/50     Pulse: 50 47    Temp:   97.5 F (36.4 C) 97.7 F (36.5 C)  TempSrc:      Resp:      Weight:      SpO2: 100% 100%     Lungs: clear to auscultation Neuro intact. Incision looks fine.  ASSESSMENT/PLAN: 1. Doing well status post right carotid endarterectomy. 2. Anticipate discharge tomorrow.  Waverly Ferrari, MD, FACS Beeper: [phone] 03/19/2011

## 2011-03-19 NOTE — H&P (View-Only) (Signed)
[facility] [place]  History and Physical Examination  Patient name: Thomas Mccarty MRN: [id number] DOB: 09/15/1925 Sex: male  REASON FOR CONSULT: greater than 80% right carotid stenosis. Referred by Dr. Foy Guadalajara  HPI: Thomas Mccarty is a 78 y.o. male who states that he had a dizzy spell on Christmas Eve (01/28/2011) and this prompted an evaluation at [facility]. Workup included a carotid duplex scan which showed a greater than 80% right carotid stenosis. Initially the patient did not want to consider surgery, however after discussing this with his primary care physician he is decided to agree to a vascular surgery consultation. The patient is unaware of any previous history of stroke, TIAs, expressive or receptive aphasia, or amaurosis fugax. Of note he is right-handed.  Past Medical History  Diagnosis Date  . Hypertension   . Hypercholesteremia   . Spinal stenosis    He denies any history of myocardial infarction, congestive heart failure, diabetes, or COPD.  History reviewed. No pertinent family history.  SOCIAL HISTORY: History  Substance Use Topics  . Smoking status: Never Smoker   . Smokeless tobacco: Never Used  . Alcohol Use: 0.6 oz/week    1 Glasses of wine per week     1 glass wine with evening meal    No Known Allergies  Current Outpatient Prescriptions  Medication Sig Dispense Refill  . aspirin 325 MG tablet Take 1 tablet (325 mg total) by mouth daily.  30 tablet  1  . Calcium Carbonate-Vitamin D (CALCIUM 600 + D PO) Take 1 tablet by mouth daily.        Marland Kitchen lisinopril (PRINIVIL,ZESTRIL) 10 MG tablet Take 10 mg by mouth daily.        Marland Kitchen lovastatin (ALTOPREV) 20 MG 24 hr tablet Take 20 mg by mouth at bedtime.        Marland Kitchen omeprazole (PRILOSEC) 20 MG capsule Take 20 mg by mouth daily.        . vitamin E 400 UNIT capsule Take 400 Units by mouth daily.          REVIEW OF SYSTEMS: Arly.Keller ] denotes positive finding; [  ] denotes negative finding    CARDIOVASCULAR:  [ ]  chest pain   [ ]  chest pressure   [ ]  palpitations   [ ]  orthopnea   [ ]  dyspnea on exertion   Arly.Keller ] claudication bilat.  [ ]  rest pain   [ ]  DVT   [ ]  phlebitis PULMONARY:   [ ]  productive cough   [ ]  asthma   [ ]  wheezing NEUROLOGIC:   [ ]  weakness  [ ]  paresthesias  [ ]  aphasia  [ ]  amaurosis  [ ]  dizziness HEMATOLOGIC:   [ ]  bleeding problems   [ ]  clotting disorders MUSCULOSKELETAL:  [ ]  joint pain   [ ]  joint swelling [ ]  leg swelling GASTROINTESTINAL: [ ]   blood in stool  [ ]   hematemesis GENITOURINARY:  [ ]   dysuria  [ ]   hematuria PSYCHIATRIC:  [ ]  history of major depression INTEGUMENTARY:  [ ]  rashes  [ ]  ulcers CONSTITUTIONAL:  [ ]  fever   [ ]  chills  PHYSICAL EXAM: Filed Vitals:   03/13/11 1506  BP: 170/72  Pulse: 56  Resp: 16  Height: 6\' 1"  (1.854 m)  Weight: 151 lb (68.493 kg)  SpO2: 98%   Body mass index is 19.92 kg/(m^2). GENERAL: The patient is a well-nourished male, in no acute distress. The vital  signs are documented above. CARDIOVASCULAR: There is a regular rate and rhythm without significant murmur appreciated. He has a left carotid bruit. He has palpable femoral pulses and adequately perfused feet without ischemic ulcers. PULMONARY: There is good air exchange bilaterally without wheezing or rales. ABDOMEN: Soft and non-tender with normal pitched bowel sounds. I cannot palpate an abdominal aortic aneurysm. MUSCULOSKELETAL: There are no major deformities or cyanosis. He has a reasonable neck mobility. NEUROLOGIC: No focal weakness or paresthesias are detected. SKIN: There are no ulcers or rashes noted. PSYCHIATRIC: The patient has a normal affect.  DATA:   Lab Results  Component Value Date   NA 134* 01/25/2011   K 4.5 01/25/2011   CL 97 01/25/2011   CO2 27 01/25/2011   Lab Results  Component Value Date   CREATININE 1.01 01/25/2011   Lab Results  Component Value Date   INR 1.00 01/25/2011   I have independently interpreted his  carotid duplex scan which shows a greater than 80% right carotid stenosis with a 40-59% left carotid stenosis. Both vertebral arteries are patent with normally directed flow.  CT scan which was done when he was being worked up for dizziness showed no acute intracranial abnormalities.  I reviewed his records from [place] his cholesterol is being closely monitored in he otherwise appears to be in very good shape for his age.  MEDICAL ISSUES:  Carotid stenosis This patient has a greater than 80% right carotid stenosis. End diastolic velocity is 131 cm/s and peak systolic velocity is 558 cm/s suggesting a critical stenosis. He is asymptomatic. However, given the severity of the stenosis I have recommended right carotid endarterectomy in order to lower his risk of future stroke. I have reviewed the indications for carotid endarterectomy, that is to lower the risk of future stroke. I have also reviewed the potential complications of surgery, including but not limited to: bleeding, stroke (perioperative risk 1-2%), MI, nerve injury of other unpredictable medical problems. All of the patients questions were answered and they are agreeable to proceed with surgery. He has no significant cardiac history and he is had no recent cardiac symptoms. He is agreeable to proceed in his surgery scheduled for 03/19/2011. He knows to continue his aspirin right up through surgery.      Moises Terpstra S [facility] [place] Beeper: [phone]

## 2011-03-19 NOTE — Progress Notes (Signed)
Pt bradycardic in the 40's. Asymptomatic. Spoke with [place] in the OR with Dr.Dickson. Orders received to cont to monitor pt..if bp drops below 100 to start Dopamine at . Will carry out orders and monitor.

## 2011-03-19 NOTE — Op Note (Signed)
NAME: HOMER MILLER   MRN: [id number] DOB: 08/08/1925    DATE OF OPERATION: 03/19/2011  PREOP DIAGNOSIS: greater than 80% right carotid stenosis  POSTOP DIAGNOSIS: same  PROCEDURE: Right carotid endarterectomy with primary closure  SURGEON: Di Kindle. Edilia Bo, MD, FACS  ASSIST: Leonides Sake M.D.  ANESTHESIA: Gen.   EBL: minimal  INDICATIONS: [name] is a 78 y.o. male who presented with a critical asymptomatic right carotid stenosis. Right carotid endarterectomy was recommended in order to lower his risk of future stroke.  FINDINGS: 90% right carotid stenosis  TECHNIQUE: The patient was brought to the operating room after an arterial line was placed by anesthesia. Patient received a general anesthetic. After careful positioning, the right neck was prepped and draped in the usual sterile fashion. An incision was made along the anterior border of the sternocleidomastoid and the dissection carried down to the common carotid artery which was dissected free and controlled with a Rummel tourniquet. The facial vein was divided between 2-0 silk ties. The internal carotid artery was controlled above the plaque and the superior thyroid artery and external carotid artery were controlled. The patient was then heparinized. Clamps were then placed on the internal then the external then the common carotid artery. A longitudinal arteriotomy was made in the common carotid artery and this was extended through the plaque into the internal carotid artery. A 12 shunt was placed into the internal carotid artery, back bled, and then placed into the common carotid artery and secured with a Rummel tourniquet. Flow was reestablished the shunt. An endarterectomy plane was established proximally. The plaque was sharply divided. Eversion endarterectomy was performed of the external carotid artery. Distally there was a nice tapering the plaque and no tacking sutures were required in the internal carotid artery. This  was a large artery and therefore elected to close this primarily. The arteriotomy was closed with 2 6-0 Prolene sutures. Prior to completing the closure, the shunt was removed and the arteries backbled and flushed appropriately and the anastomosis completed. Flow was reestablished first to the external carotid artery and into the internal carotid artery. The heparin was partially reversed with protamine. There was excellent Doppler flow distal to the repair. The wound was closed with deep layer of 3-0 Vicryl, the platysma was closed with running 3-0 Vicryl, and the skin was closed with a 4-0 subcuticular stitch. Dermabond was applied. The patient awoke neurologically intact. All needle and sponge counts were correct. The patient was transferred to the recovery room in stable condition.  Waverly Ferrari, MD, FACS [facility]  DATE OF DICTATION:   03/19/2011

## 2011-03-19 NOTE — Anesthesia Postprocedure Evaluation (Signed)
Anesthesia Post Note  Patient: Thomas Mccarty  Procedure(s) Performed: Procedure(s) (LRB): ENDARTERECTOMY CAROTID (Right)  Anesthesia type: general  Patient location: PACU  Post pain: Pain level controlled  Post assessment: Patient's Cardiovascular Status Stable  Last Vitals:  Filed Vitals:   03/19/11 1000  BP:   Pulse:   Temp: 36.7 C  Resp:     Post vital signs: Reviewed and stable  Level of consciousness: sedated  Complications: No apparent anesthesia complications

## 2011-03-19 NOTE — Transfer of Care (Signed)
Immediate Anesthesia Transfer of Care Note  Patient: Thomas Mccarty  Procedure(s) Performed: Procedure(s) (LRB): ENDARTERECTOMY CAROTID (Right)  Patient Location: PACU  Anesthesia Type: General  Level of Consciousness: awake  Airway & Oxygen Therapy: Patient Spontanous Breathing and Patient connected to nasal cannula oxygen  Post-op Assessment: Report given to PACU RN and Post -op Vital signs reviewed and stable  Post vital signs: Reviewed and stable  Complications: No apparent anesthesia complications

## 2011-03-19 NOTE — Anesthesia Preprocedure Evaluation (Signed)
Anesthesia Evaluation  Patient identified by MRN, date of birth, ID band Patient awake    Reviewed: Allergy & Precautions, H&P , NPO status , Patient's Chart, lab work & pertinent test results  Airway Mallampati: II TM Distance: >3 FB Neck ROM: full    Dental   Pulmonary          Cardiovascular hypertension, On Medications     Neuro/Psych    GI/Hepatic GERD-  Controlled,  Endo/Other    Renal/GU      Musculoskeletal   Abdominal   Peds  Hematology   Anesthesia Other Findings   Reproductive/Obstetrics                           Anesthesia Physical Anesthesia Plan  ASA: III  Anesthesia Plan: General ETT   Post-op Pain Management:    Induction:   Airway Management Planned:   Additional Equipment:   Intra-op Plan:   Post-operative Plan:   Informed Consent: I have reviewed the patients History and Physical, chart, labs and discussed the procedure including the risks, benefits and alternatives for the proposed anesthesia with the patient or authorized representative who has indicated his/her understanding and acceptance.     Plan Discussed with: CRNA and Surgeon  Anesthesia Plan Comments:         Anesthesia Quick Evaluation

## 2011-03-20 ENCOUNTER — Encounter (HOSPITAL_COMMUNITY): Payer: Self-pay | Admitting: Vascular Surgery

## 2011-03-20 LAB — BASIC METABOLIC PANEL
Calcium: 9.2 mg/dL (ref 8.4–10.5)
GFR calc Af Amer: 87 mL/min — ABNORMAL LOW (ref >90)
GFR calc non Af Amer: 75 mL/min — ABNORMAL LOW (ref >90)
Glucose, Bld: 87 mg/dL (ref 70–99)
Potassium: 4.4 mEq/L (ref 3.5–5.1)
Sodium: 136 mEq/L (ref 135–145)

## 2011-03-20 LAB — CBC
MCH: 29.3 pg (ref 26.0–34.0)
MCHC: 33.1 g/dL (ref 30.0–36.0)
Platelets: 139 10*3/uL — ABNORMAL LOW (ref 150–400)
RDW: 13.1 % (ref 11.5–15.5)

## 2011-03-20 MED ORDER — TAMSULOSIN HCL 0.4 MG PO CAPS
0.4000 mg | ORAL_CAPSULE | Freq: Every morning | ORAL | Status: DC
  Administered 2011-03-20: 0.4 mg via ORAL
  Filled 2011-03-20 (×2): qty 1

## 2011-03-20 MED ORDER — OXYCODONE-ACETAMINOPHEN 5-325 MG PO TABS: 2.0000 | ORAL_TABLET | Freq: Four times a day (QID) | ORAL | Status: AC | PRN

## 2011-03-20 NOTE — Progress Notes (Signed)
VASCULAR PROGRESS NOTE  SUBJECTIVE: No specific complaints  PHYSICAL EXAM: Filed Vitals:   03/19/11 1900 03/19/11 2000 03/20/11 0000 03/20/11 0428  BP: 114/40 116/52 105/47   Pulse: 49 57    Temp:  97.9 F (36.6 C) 98.2 F (36.8 C) 98.2 F (36.8 C)  TempSrc:  Oral Oral Oral  Resp: 14 15    Height:      Weight:      SpO2: 99% 98%     Lungs: clear to auscultation Neuro intact Incision looks fine right neck   LABS: Lab Results  Component Value Date   WBC 5.0 03/20/2011   HGB 11.0* 03/20/2011   HCT 33.2* 03/20/2011   MCV 88.3 03/20/2011   PLT 139* 03/20/2011   Lab Results  Component Value Date   CREATININE 0.94 03/20/2011   Lab Results  Component Value Date   INR 1.00 03/18/2011   CBG (last 3)  No results found for this basename: GLUCAP:3 in the last 72 hours   ASSESSMENT/PLAN: 1. 1 Day Post-Op s/p: Right carotid endarterectomy 2. Home today with follow up in 2 weeks.  Waverly Ferrari, MD, FACS Beeper: [phone] 03/20/2011

## 2011-03-20 NOTE — Discharge Summary (Addendum)
[facility] Discharge Summary   Patient ID:  Thomas Mccarty MRN: [id number] DOB/AGE: 76-Mar-1927 76 y.o.  Admit date: 03/19/2011 Discharge date: 03/20/2011 Date of Surgery: 03/19/2011 Surgeon: Surgeon(s): Chuck Hint, MD Nilda Simmer, MD  Admission Diagnosis: Right Internal Carotid Artery Stenosis  Discharge Diagnoses:  Right Internal Carotid Artery Stenosis  Secondary Diagnoses: Past Medical History  Diagnosis Date  . Hypercholesteremia     takes lovastatin daily  . Spinal stenosis   . Hypertension     takes Lisinopril daily  . Spinal stenosis   . Back pain   . Carotid stenosis     right  . GERD (gastroesophageal reflux disease)     takes Omeprazole daily  . Early cataracts, bilateral   . Macular degeneration     dry  . Impaired hearing     Procedure(s): ENDARTERECTOMY CAROTID  Discharged Condition: good  HPI:  Thomas Mccarty is a 78 y.o. male who states that he had a dizzy spell on Christmas Eve (01/28/2011) and this prompted an evaluation at [facility]. Workup included a carotid duplex scan which showed a greater than 80% right carotid stenosis. Initially the patient did not want to consider surgery, however after discussing this with his primary care physician he is decided to agree to a vascular surgery consultation. The patient is unaware of any previous history of stroke, TIAs, expressive or receptive aphasia, or amaurosis fugax. Of note he is right-handed.   Hospital Course:  Thomas Mccarty is a 78 y.o. male is S/P Right Procedure(s): ENDARTERECTOMY CAROTID Extubated: POD # 0 Post-op wounds clean, dry, intact or healing well Pt. Ambulating, voiding and taking PO diet without difficulty. Pt pain controlled with PO pain meds. Labs as below Complications: hypotension with symptoms of dizziness post-op  Consults:     Significant Diagnostic Studies: CBC Lab Results  Component Value Date   WBC 5.0 03/20/2011     HGB 11.0* 03/20/2011   HCT 33.2* 03/20/2011   MCV 88.3 03/20/2011   PLT 139* 03/20/2011    BMET    Component Value Date/Time   NA 136 03/20/2011 0500   K 4.4 03/20/2011 0500   CL 105 03/20/2011 0500   CO2 25 03/20/2011 0500   GLUCOSE 87 03/20/2011 0500   BUN 14 03/20/2011 0500   CREATININE 0.94 03/20/2011 0500   CALCIUM 9.2 03/20/2011 0500   GFRNONAA 75* 03/20/2011 0500   GFRAA 87* 03/20/2011 0500   COAG Lab Results  Component Value Date   INR 1.00 03/18/2011   INR 1.00 01/25/2011     Disposition:  Discharge to :Home Discharge Orders    Future Appointments: Provider: Department: Dept Phone: Center:   04/03/2011 3:15 PM Chuck Hint, MD [facility] [phone] [facility]      [place] [place]  Home Medication Instructions BMW:[id number]   Printed on:03/20/11 0727  Medication Information                    lisinopril (PRINIVIL,ZESTRIL) 10 MG tablet Take 10 mg by mouth daily.             lovastatin (ALTOPREV) 20 MG 24 hr tablet Take 20 mg by mouth at bedtime.             vitamin E 400 UNIT capsule Take 400 Units by mouth daily.             omeprazole (PRILOSEC) 20 MG capsule Take 20 mg by mouth daily.  Calcium Carbonate-Vitamin D (CALCIUM 600 + D PO) Take 1 tablet by mouth daily.            aspirin 325 MG tablet Take 325 mg by mouth daily.           diazepam (VALIUM) 5 MG tablet Take 5 mg by mouth every 6 (six) hours as needed.            Verbal and written Discharge instructions given to the patient. Wound care per Discharge AVS.   F/U in 2 weeks with Dr. Edilia Bo Patient has independently voided and improved symptoms of hypotension D/C home. BP 130/52 this am. He will resume all home med's and take tylenol for pain.  SignedMosetta Pigeon 03/20/2011, 7:27 AM

## 2011-03-20 NOTE — Progress Notes (Signed)
Utilization review completed. Oval Moralez, RN, BSN. 03/20/11  

## 2011-03-20 NOTE — Discharge Summary (Signed)
Agree with above. Patient neurologically intact. His incision looks fine. I'll see him back in 2 weeks. Thomas Mccarty. Edilia Bo, MD, FACS Beeper [phone] 03/20/2011

## 2011-03-21 NOTE — Discharge Instructions (Signed)
Carotid Stenosis and Carotid Endarterectomy The carotid arteries are the large arteries in the neck that supply blood to the brain. Carotid stenosis is the narrowing of the carotid arteries. This narrowing can put you at risk for a stroke because it decreases blood flow to the brain (ischemic stroke or transient ischemic attack [TIA]). Carotid stenosis is also called carotid artery disease. CAUSES  Carotid stenosis is usually caused by a buildup of plaque in the arteries. Plaque is also called atherosclerosis. Plaque can build up in any of your arteries. This commonly occurs as we grow older. SYMPTOMS  Symptoms of decreased blood to your brain include:   Feeling faint.   Numbness or weakness in the arms or legs.   Difficulty with movements.   Difficulty speaking.   Vision problems.  If the arteries are left untreated, you are at risk of having a stroke or TIA. TREATMENT  Nonsurgical Treatment If appropriate, your caregiver will suggest medical options other than surgery. These include taking aspirin or other medicines that thin your blood (anticoagulants). Your caregiver can help you decide if these are acceptable treatment methods for you. Surgical Treatment: Carotid Endarterectomy A carotid endarterectomy is a surgical procedure to clear blockages in the carotid artery. RISKS AND COMPLICATIONS  Bleeding.   Infection.   Stroke or TIA.  BEFORE THE PROCEDURE   Do not eat or drink for as long as directed by your caregiver before the procedure.   Your caregiver will advise you if there are any medicines that need to be withheld before the surgery, and for how long.  PROCEDURE  You will be given a drug to make you sleep (general anesthesia). After you receive the anesthetic, the surgeon will make a small cut (incision) in your neck to expose the artery. An incision is made in the artery and the plaque is removed. The artery is then repaired and the incision is closed in your neck with  stitches (sutures). AFTER THE PROCEDURE  You will stay in the hospital right after surgery. Recovery time varies, depending on your age, condition, general health, and other factors. You are usually able to return to a normal lifestyle within a few weeks. HOME CARE INSTRUCTIONS   It is normal to be sore for a couple weeks after surgery. See your caregiver if this seems to be getting worse rather than better.   Take showers, not baths, for a few days after surgery or until instructed otherwise by your caregiver. Do not take a bath or swim until directed by your surgeon.   Only take over-the-counter or prescription medicines for pain, discomfort, or fever as directed by your caregiver.   An anticoagulant may be prescribed after surgery. This medicine should be taken exactly as directed.   Change bandages (dressings) as directed by your caregiver.   Resume your normal activities as directed by your caregiver.   Avoid lifting until you are instructed otherwise.   Make an appointment to see your caregiver for suture or staple removal when instructed.   Stop smoking if you smoke. This is a grave risk factor.   Stop taking the pill (oral contraceptives) unless your caregiver recommends otherwise.   Maintain good blood pressure control.   Exercise regularly or as instructed.   Lower blood lipids (cholesterol and triglycerides).   Eat a heart-healthy diet. Manage heart problems if they are contributing to your risk.  SEEK MEDICAL CARE IF:   There is increased bleeding from the wound.   You notice redness,   swelling, or increasing pain in the wound.   You notice swelling in your neck or have difficulty breathing or talking.   You notice a bad smell or pus coming from the wound or dressing.   You have an oral temperature above 102 F (38.9 C).  SEEK IMMEDIATE MEDICAL CARE IF:   Your initial symptoms are getting worse instead of better.   You develop any abnormal bruising or  bleeding.   You develop a rash.   You have difficulty breathing.   You develop any reaction or side effects to medicine given.   You develop chest pain, shortness of breath, or pain or swelling in your legs.   You have a return of symptoms or problems that caused you to have this surgery.   You develop a temporary loss of vision.   You develop temporary numbness on one side.   You develop a temporary inability to speak (aphasia).   You develop temporary areas of weakness.   You have problems or concerns that have not been answered.  MAKE SURE YOU:   Understand these instructions.   Will watch your condition.   Will get help right away if you are not doing well or get worse.  Document Released: 01/19/2000 Document Revised: 10/03/2010 Document Reviewed: 07/29/2008 ExitCare Patient Information 2012 ExitCare, LCarotid Stenosis and Carotid Endarterectomy The carotid arteries are the large arteries in the neck that supply blood to the brain. Carotid stenosis is the narrowing of the carotid arteries. This narrowing can put you at risk for a stroke because it decreases blood flow to the brain (ischemic stroke or transient ischemic attack [TIA]). Carotid stenosis is also called carotid artery disease. CAUSES  Carotid stenosis is usually caused by a buildup of plaque in the arteries. Plaque is also called atherosclerosis. Plaque can build up in any of your arteries. This commonly occurs as we grow older. SYMPTOMS  Symptoms of decreased blood to your brain include:   Feeling faint.   Numbness or weakness in the arms or legs.   Difficulty with movements.   Difficulty speaking.   Vision problems.  If the arteries are left untreated, you are at risk of having a stroke or TIA. TREATMENT  Nonsurgical Treatment If appropriate, your caregiver will suggest medical options other than surgery. These include taking aspirin or other medicines that thin your blood (anticoagulants). Your  caregiver can help you decide if these are acceptable treatment methods for you. Surgical Treatment: Carotid Endarterectomy A carotid endarterectomy is a surgical procedure to clear blockages in the carotid artery. RISKS AND COMPLICATIONS  Bleeding.   Infection.   Stroke or TIA.  BEFORE THE PROCEDURE   Do not eat or drink for as long as directed by your caregiver before the procedure.   Your caregiver will advise you if there are any medicines that need to be withheld before the surgery, and for how long.  PROCEDURE  You will be given a drug to make you sleep (general anesthesia). After you receive the anesthetic, the surgeon will make a small cut (incision) in your neck to expose the artery. An incision is made in the artery and the plaque is removed. The artery is then repaired and the incision is closed in your neck with stitches (sutures). AFTER THE PROCEDURE  You will stay in the hospital right after surgery. Recovery time varies, depending on your age, condition, general health, and other factors. You are usually able to return to a normal lifestyle within a few  weeks. HOME CARE INSTRUCTIONS   It is normal to be sore for a couple weeks after surgery. See your caregiver if this seems to be getting worse rather than better.   Take showers, not baths, for a few days after surgery or until instructed otherwise by your caregiver. Do not take a bath or swim until directed by your surgeon.   Only take over-the-counter or prescription medicines for pain, discomfort, or fever as directed by your caregiver.   An anticoagulant may be prescribed after surgery. This medicine should be taken exactly as directed.   Change bandages (dressings) as directed by your caregiver.   Resume your normal activities as directed by your caregiver.   Avoid lifting until you are instructed otherwise.   Make an appointment to see your caregiver for suture or staple removal when instructed.   Stop smoking  if you smoke. This is a grave risk factor.   Stop taking the pill (oral contraceptives) unless your caregiver recommends otherwise.   Maintain good blood pressure control.   Exercise regularly or as instructed.   Lower blood lipids (cholesterol and triglycerides).   Eat a heart-healthy diet. Manage heart problems if they are contributing to your risk.  SEEK MEDICAL CARE IF:   There is increased bleeding from the wound.   You notice redness, swelling, or increasing pain in the wound.   You notice swelling in your neck or have difficulty breathing or talking.   You notice a bad smell or pus coming from the wound or dressing.   You have an oral temperature above 102 F (38.9 C).  SEEK IMMEDIATE MEDICAL CARE IF:   Your initial symptoms are getting worse instead of better.   You develop any abnormal bruising or bleeding.   You develop a rash.   You have difficulty breathing.   You develop any reaction or side effects to medicine given.   You develop chest pain, shortness of breath, or pain or swelling in your legs.   You have a return of symptoms or problems that caused you to have this surgery.   You develop a temporary loss of vision.   You develop temporary numbness on one side.   You develop a temporary inability to speak (aphasia).   You develop temporary areas of weakness.   You have problems or concerns that have not been answered.  MAKE SURE YOU:   Understand these instructions.   Will watch your condition.   Will get help right away if you are not doing well or get worse.  Document Released: 01/19/2000 Document Revised: 10/03/2010 Document Reviewed: 07/29/2008 [facility] Patient Information 2012 [place], [place].Carotid Stenosis and Carotid Endarterectomy Care After Refer to this sheet in the next few weeks. These discharge instructions provide you with general information on caring for yourself after you leave the hospital. Your caregiver may also give you  specific instructions. Your treatment has been planned according to the most current medical practices available, but unavoidable complications sometimes occur. If you have any problems or questions after discharge, please call your caregiver. HOME CARE INSTRUCTIONS   It is normal to be sore for a couple weeks after surgery. See your caregiver if this seems to be getting worse rather than better.   Take showers, not baths, for a few days after surgery or until instructed otherwise by your caregiver. Do not take baths or swim until directed by your surgeon.   Only take over-the-counter or prescription medicines for pain, discomfort, or fever as directed  by your caregiver.   A blood thinner (anticoagulant) may be prescribed after surgery. This medicine should be taken exactly as directed.   Change bandages (dressings) as directed by your caregiver.   Resume your normal activities as directed by your caregiver.   Avoid lifting until you are instructed otherwise.   Make an appointment to see your caregiver for stitches (suture) or staple removal when instructed.   Stop smoking if you smoke. This is a grave risk factor.   Stop taking the pill (oral contraceptives) unless your caregiver recommends otherwise.   Maintain good blood pressure control.   Exercise regularly or as instructed.   Lower blood lipids (cholesterol and triglycerides).   Eat a heart-healthy diet. Manage heart problems if they are contributing to your risk.  SEEK MEDICAL CARE IF:   There is increased bleeding from the wound.   You notice redness, swelling, or increasing pain in the wound.   You notice swelling in your neck or have difficulty breathing or talking.   You notice a bad smell or pus coming from the wound or dressing.   You have an oral temperature above 102 F (38.9 C).  SEEK IMMEDIATE MEDICAL CARE IF:   Your initial symptoms are getting worse rather than better.   You develop any abnormal  bruising or bleeding.   You develop a rash.   You have difficulty breathing.   You develop any reaction or side effects to medicine given.   You develop chest pain, shortness of breath, or pain or swelling in your legs.   You have a return of symptoms or problems that caused you to have this surgery.   You develop a temporary loss of vision.   You develop temporary numbness on one side.   You develop a temporary inability to speak (aphasia).   You develop temporary areas of weakness.   You have problems or concerns that have not been answered.  MAKE SURE YOU:   Understand these instructions.   Will watch your condition.   Will get help right away if you are not doing well or get worse.  Document Released: 08/10/2004 Document Revised: 08/16/2010 Document Reviewed: 01/20/2008 ExitCare Patient Information 2012 [employer].LC.

## 2011-03-21 NOTE — Progress Notes (Signed)
Pt d/c home with son via w/c with belongings. Maureen PA cancelled percocet and stated for pt to only take tylenol corrected that on pt discharge instructions. D/c instructions, f/u appt, care notes on carotid stenosis and carotic endarterctomy discussed with son and pt. No complaints voiced and verbalized understanding. Beryle Quant

## 2011-03-25 NOTE — Discharge Summary (Signed)
Agree with above. Di Kindle. Edilia Bo, MD, FACS Beeper [phone] 03/25/2011

## 2011-03-27 ENCOUNTER — Encounter: Payer: Medicare Other | Admitting: Vascular Surgery

## 2011-04-02 ENCOUNTER — Encounter: Payer: Self-pay | Admitting: Vascular Surgery

## 2011-04-03 ENCOUNTER — Encounter: Payer: Self-pay | Admitting: Vascular Surgery

## 2011-04-03 ENCOUNTER — Ambulatory Visit (INDEPENDENT_AMBULATORY_CARE_PROVIDER_SITE_OTHER): Payer: Medicare Other | Admitting: Vascular Surgery

## 2011-04-03 VITALS — BP 138/57 | HR 75 | Resp 20 | Ht 73.0 in | Wt 150.0 lb

## 2011-04-03 DIAGNOSIS — I6529 Occlusion and stenosis of unspecified carotid artery: Secondary | ICD-10-CM

## 2011-04-03 NOTE — Progress Notes (Signed)
[facility] [place]  Patient name: Thomas Mccarty MRN: [id number] DOB: 22-Jun-1925 Sex: male  REASON FOR VISIT: Follow up after right carotid endarterectomy  HPI: Thomas Mccarty is a 78 y.o. male who presented with a critical asymptomatic right carotid stenosis and a 40-59% left carotid stenosis. He had been referred by Dr. Foy Guadalajara. He underwent an uneventful right carotid endarterectomy with primary closure on 03/19/2011. He comes in for his first postoperative visit. He has had no focal weakness or paresthesias. His had no difficulty swallowing. He continues to take aspirin.  REVIEW OF SYSTEMS: Arly.Keller ] denotes positive finding; [  ] denotes negative finding  CARDIOVASCULAR:  [ ]  chest pain   [ ]  chest pressure   [ ]  palpitations   [ ]  orthopnea   CONSTITUTIONAL:  [ ]  fever   [ ]  chills  PHYSICAL EXAM: Filed Vitals:   04/03/11 1536  BP: 138/57  Pulse: 75  Resp: 20  Height: 6\' 1"  (1.854 m)  Weight: 150 lb (68.04 kg)   Body mass index is 19.79 kg/(m^2). GENERAL: The patient is a well-nourished male, in no acute distress. The vital signs are documented above. CARDIOVASCULAR: There is a regular rate and rhythm without significant murmur appreciated. I do not detect carotid bruits. PULMONARY: There is good air exchange bilaterally without wheezing or rales. NEUROLOGIC: No focal weakness or paresthesias are detected. His right neck incision has healed nicely.  MEDICAL ISSUES: Overall pleased with his progress. He is doing well after right carotid endarterectomy. I've instructed him that he can gradually resume his normal activities. He knows to continue taking his aspirin. I've ordered a follow up carotid duplex scan in 6 months and we will see him back at that time. We'll follow his moderate 40-59% left carotid stenosis. He knows to call sooner if he has problems.  Nisreen Guise S [facility]  [place]: [phone]

## 2011-05-03 DIAGNOSIS — H353190 Nonexudative age-related macular degeneration, unspecified eye, stage unspecified: Secondary | ICD-10-CM | POA: Diagnosis not present

## 2011-05-03 DIAGNOSIS — H35369 Drusen (degenerative) of macula, unspecified eye: Secondary | ICD-10-CM | POA: Diagnosis not present

## 2011-05-03 DIAGNOSIS — H3530 Unspecified macular degeneration: Secondary | ICD-10-CM | POA: Insufficient documentation

## 2011-05-23 DIAGNOSIS — B372 Candidiasis of skin and nail: Secondary | ICD-10-CM | POA: Diagnosis not present

## 2011-05-23 DIAGNOSIS — R6884 Jaw pain: Secondary | ICD-10-CM | POA: Diagnosis not present

## 2011-06-12 DIAGNOSIS — Z85820 Personal history of malignant melanoma of skin: Secondary | ICD-10-CM | POA: Diagnosis not present

## 2011-06-12 DIAGNOSIS — D235 Other benign neoplasm of skin of trunk: Secondary | ICD-10-CM | POA: Diagnosis not present

## 2011-06-12 DIAGNOSIS — L820 Inflamed seborrheic keratosis: Secondary | ICD-10-CM | POA: Diagnosis not present

## 2011-06-12 DIAGNOSIS — L570 Actinic keratosis: Secondary | ICD-10-CM | POA: Diagnosis not present

## 2011-08-21 DIAGNOSIS — C4359 Malignant melanoma of other part of trunk: Secondary | ICD-10-CM | POA: Diagnosis not present

## 2011-08-21 DIAGNOSIS — D235 Other benign neoplasm of skin of trunk: Secondary | ICD-10-CM | POA: Diagnosis not present

## 2011-08-21 DIAGNOSIS — L570 Actinic keratosis: Secondary | ICD-10-CM | POA: Diagnosis not present

## 2011-09-04 DIAGNOSIS — L570 Actinic keratosis: Secondary | ICD-10-CM | POA: Diagnosis not present

## 2011-09-04 DIAGNOSIS — Z85820 Personal history of malignant melanoma of skin: Secondary | ICD-10-CM | POA: Diagnosis not present

## 2011-09-05 HISTORY — PX: MELANOMA EXCISION WITH SENTINEL LYMPH NODE BIOPSY: SHX5267

## 2011-09-17 ENCOUNTER — Ambulatory Visit (INDEPENDENT_AMBULATORY_CARE_PROVIDER_SITE_OTHER): Payer: Medicare Other | Admitting: General Surgery

## 2011-09-17 ENCOUNTER — Encounter (INDEPENDENT_AMBULATORY_CARE_PROVIDER_SITE_OTHER): Payer: Self-pay | Admitting: General Surgery

## 2011-09-17 VITALS — BP 154/58 | HR 68 | Temp 97.2°F | Resp 16 | Ht 75.0 in | Wt 152.4 lb

## 2011-09-17 DIAGNOSIS — C4359 Malignant melanoma of other part of trunk: Secondary | ICD-10-CM | POA: Diagnosis not present

## 2011-09-17 NOTE — Progress Notes (Signed)
Chief Complaint  Patient presents with  . Other    eval melanoma upper back    HISTORY: Pt is an 78 year old male who presents with a large left upper back nevus.  [place] and many people commented on the mole. It was not present or concerning at this last visit with Dr. Charlton Haws several months ago.  He states that it may have itched a little, but he could not see it to note it changing pigment.  Dr. Charlton Haws documented that it was an erythematous hyperkeratotic papule 1 cm in size.  Shave biopsy demonstrated malignant nodular melanoma.  He has history of melanoma and numerous other skin lesions.  His daughter states that he usually gets at least one or two biopsied at every dermatology visit.  He has not felt or seen any other concerning lesions.  He has significant sun exposure.  His skin is fair with freckles.  He burns, then tans.    Past Medical History  Diagnosis Date  . Hypercholesteremia     takes lovastatin daily  . Spinal stenosis   . Hypertension     takes Lisinopril daily  . Spinal stenosis   . Back pain   . Carotid stenosis     right  . GERD (gastroesophageal reflux disease)     takes Omeprazole daily  . Early cataracts, bilateral   . Macular degeneration     dry  . Impaired hearing     Past Surgical History  Procedure Date  . Appendectomy     as a child  . Cholecystectomy approx. 1992  . Endarterectomy 03/19/2011    Procedure: ENDARTERECTOMY CAROTID;  Surgeon: Chuck Hint, MD;  Location: [facility] OR;  Service: Vascular;  Laterality: Right;  Right Carotid Endarterectomy with Primary Closure    Current Outpatient Prescriptions  Medication Sig Dispense Refill  . aspirin 325 MG tablet Take 325 mg by mouth daily.      . Calcium Carbonate-Vitamin D (CALCIUM 600 + D PO) Take 1 tablet by mouth daily.       . diazepam (VALIUM) 5 MG tablet Take 5 mg by mouth every 6 (six) hours as needed.      Marland Kitchen lisinopril (PRINIVIL,ZESTRIL) 10 MG tablet Take 10 mg by  mouth daily.        Marland Kitchen lovastatin (ALTOPREV) 20 MG 24 hr tablet Take 20 mg by mouth at bedtime.        Marland Kitchen omeprazole (PRILOSEC) 20 MG capsule Take 20 mg by mouth daily.        . simvastatin (ZOCOR) 20 MG tablet Take 20 mg by mouth Daily.      . vitamin E 400 UNIT capsule Take 400 Units by mouth daily.           No Known Allergies   Family History  Problem Relation Age of Onset  . Anesthesia problems Neg Hx   . Hypotension Neg Hx   . Malignant hyperthermia Neg Hx   . Pseudochol deficiency Neg Hx      History   Social History  . Marital Status: Single    Spouse Name: N/A    Number of Children: N/A  . Years of Education: N/A   Social History Main Topics  . Smoking status: Former Smoker    Types: Cigarettes, Pipe  . Smokeless tobacco: Former Neurosurgeon   Comment: quit 40+yrs ago  . Alcohol Use: 0.6 oz/week    1 Glasses of wine per week  1 glass of wine every night with evening mean  . Drug Use: No  . Sexually Active: No     REVIEW OF SYSTEMS - PERTINENT POSITIVES ONLY: 12 point review of systems negative other than HPI and PMH   EXAM: Filed Vitals:   09/17/11 1123  BP: 154/58  Pulse: 68  Temp: 97.2 F (36.2 C)  Resp: 16    Gen:  No acute distress.  Well nourished and well groomed.   Neurological: Alert and oriented to person, place, and time. Coordination normal. Hard of hearing. Head: Normocephalic and atraumatic. Numerous actinic keratoses.   Eyes: Conjunctivae are normal. Pupils are equal, round, and reactive to light. No scleral icterus.  Neck: Normal range of motion. Neck supple. No tracheal deviation or thyromegaly present.  Cardiovascular: Normal rate, regular rhythm, normal heart sounds and intact distal pulses.  Exam reveals no gallop and no friction rub.  No murmur heard. Respiratory: Effort normal.  No respiratory distress. No chest wall tenderness. Breath sounds normal.  No wheezes, rales or rhonchi.  GI: Soft. Bowel sounds are normal. The abdomen is  soft and nontender.  There is no rebound and no guarding.  Musculoskeletal: Normal range of motion. Extremities are nontender.  Lymphadenopathy: No cervical, preauricular, postauricular or axillary adenopathy is present Skin: Skin is warm and dry. No rash noted. No diaphoresis. No erythema. No pallor. No clubbing, cyanosis, or edema.  Many lesions/ scars seen.  None hyperpigmented.  Biopsy site is just below and lateral to angle of scapula.  Biopsy defect is around 1.3 cm.   Psychiatric: Normal mood and affect. Behavior is normal. Judgment and thought content normal.    LABORATORY RESULTS: Available labs are reviewed  Pathology demonstrates Clarks IV 3.35 mm Nodular melanoma.  Ulceration present, 10 mitoses/mm2, no LVI.  Margin close.    RADIOLOGY RESULTS: See E-Chart or I-Site for most recent results.  Images and reports are reviewed. None relevant.   ASSESSMENT AND PLAN: [id number] Melanoma of back Plan wide local excision, advancement flap closure, sentinel lymph node biopsy.  Due to depth and rapidity of growth, will refer to oncology even if SLN negative.    Discussed surgery.   Will need 2 cm margins.  Advised patient and daughter that this may be able to closed in straight line, but sometimes a keystone flap will be needed, in which case the scar may be angled.    I discussed the principle behind sentinel lymph node biopsy, what is in, and what surgery consists of.  His back melanoma is likely to map to the left axilla only given the proximity of the upper back lesion to the axilla.    I would have the patient in a sling afterward in order to minimize LUE mobility.   The patient was informed that the skin around the melanoma would be numb.    Risks of surgery include Bleeding Infection Damage to adjacent structures  Wound breakdown. Cancer recurrence  They wish to proceed with surgery.       Maudry Diego MD Surgical Oncology, General and Endocrine Surgery [facility]      Visit Diagnoses: 1. Melanoma of back     Primary Care Physician: Lenora Boys, MD

## 2011-09-17 NOTE — Patient Instructions (Signed)
Melanoma Melanoma is the least common, but most dangerous, form of skin cancer. This is because it can spread (metastasize) to other organs and can be life-threatening. Melanoma is a cancerous (malignant) tumor that begins in a certain type of cells, called melanocytes. Melanocytes are the cells that produce the color (pigment) called melanin. Melanin colors our skin, hair, eyes, and moles. CAUSES  The exact cause of melanoma is unknown. You may have a higher risk if you:  Spend or have spent a lot of time in the sun. This includes sunlamp and tanning booth exposure.   Have had sunburns. This put you at a particularly increased risk for melanoma. The more blistering sunburns a person has, the higher the risk.   Spend time in parts of the world with more intense sunlight.   Have fair skin that does not tan easily. You may have a lower risk if you have a darker skin color. However, people with darker skin can get melanoma, especially on the hands and feet (acral areas).   Have a close relative (parent, sibling) who has melanoma.   Have a large number of skin moles (more than 100).  SYMPTOMS  A skin mole is suspicious if it has any of these 5 traits. This is called the ABCDE's of melanoma:  Asymmetry: Irregular shape, not simply round or oval.   Border: Edge of the mole is irregular, not smooth.   Color: Mole may have multiple colors in it, including brown, black, blue, red, or tan.   Diameter: More than 0.2 inches (6 mm) across.   Evolving: Any unusual change or symptoms in the mole, such as pain, itching, stinging, sensitivity, or bleeding.  A mole that is noticeably changing in appearance, or any new mole, should be checked for melanoma. In general, people develop new moles until age 27. New moles after this age should be brought to the attention of your caregiver. DIAGNOSIS  Your caregiver can look at your skin and find lesions or moles that may be suspicious. A patient may also notice  a mole with symptoms or a mole that does not look like most of the other moles on his or her body. This is called the "ugly duckling" sign. A tissue sample (biopsy) examined under a microscope is needed to determine if it is melanoma. The size and extent of the biopsy will depend on the location, size, and appearance of the skin lesion or mole. The biopsy can also reveal whether melanoma has spread to deeper layers of the skin. TREATMENT  Surgery to completely remove the melanoma is required. Lymph nodes may also be removed. If the melanoma has spread to other organs, such as the liver, lungs, bone, or brain, cancer-fighting drugs (chemotherapy) must be used. Your caregiver will discuss your treatment options with you. You can ask about being included in a clinical trial to evaluate new forms of treatment. Melanoma can occasionally recur years after the initial diagnosis. If you have melanoma, you will need follow-up visits with your caregiver for many years. PREVENTION  Risk for melanoma can be reduced by minimizing sun exposure. Practice the 3 S's:  Slip on a shirt.   Slop on sunscreen.   Slap on a hat.  Do not spend time in the sun during peak midafternoon hours. Sunscreen/sunblock with SPF 30 or higher and UVA/UVB block should be applied regularly. You should do this even during brief exposure to sunlight. You should also do this on cloudy days and in winter, even  though the perceived sunlight is less. Always avoid sunburn! Wear sunglasses that block UV light. Be sure to see your caregiver if you have any new or changing moles. HOME CARE INSTRUCTIONS   Follow wound care instructions after surgical removal of your melanoma.   Practice good sun avoidance and protective measures as described above.   Let your close family members (parents, children, siblings) know about your diagnosis. This puts them at a higher risk of getting melanoma than the general population.  SEEK MEDICAL CARE IF:   You  notice any new moles, or you have any moles that are changing.   You have had a melanoma removed and you notice a new growth near the same location.   You have had a melanoma removed and you experience any new or unexplained health problems.    Sentinel Lymph Node Biopsy Care After Refer to this sheet over the next few weeks. These instructions give you general information on caring for yourself after you leave the hospital. Your doctor may also give you specific instructions. Your treatment has been planned according to the most current medical practices available. However, problems sometimes occur. If you have any problems or questions after discharge, call your caregiver. ACTIVITY  Limit your activity as directed.   Stop vigorous exercise as directed.   Refrain from repetitive motion on the side of surgery as directed.   You may shower 24 hours after your surgery.   It is okay to get the cut from surgery (incision) wet.   Gently pat the incision dry.   You should wear the surgical bra supplied to you (or one of your own) for the next 48 hours. You may remove the bra to shower.  INCISION  The paper tapes or skin adhesive strips should remain in place until your follow-up visit.   It is okay if the tapes fall off. There is no need to replace them if this happens.  DISCOMFORT  It is common to have discomfort (aching or pain) for the first few days after surgery. This is normal.   Mild anti-inflammatory medicines taken regularly for the next 3 days after surgery are usually able to control any discomfort you may have. Take only as directed.   Report any pain not controlled by medicine to your surgeon or caregiver.  NUTRITION  You may resume your regular diet.   You may wish to eat a light meal for your first meal after surgery.  AFTER SURGERY  The color of your urine may be blue for the next 24 hours. This is due to the blue dye used during surgery. This is normal.   The  dye may also cause a blue tint to your breast. This will go away.  SEEK IMMEDIATE MEDICAL CARE IF:   Your pain is not controlled by medicine.   You notice redness, swelling, or increased drainage at the surgery site.   You feel sick to your stomach (nausea) or vomit.

## 2011-09-17 NOTE — Assessment & Plan Note (Addendum)
Plan wide local excision, advancement flap closure, sentinel lymph node biopsy.  Due to depth and rapidity of growth, will refer to oncology even if SLN negative.    Discussed surgery.   Will need 2 cm margins.  Advised patient and daughter that this may be able to closed in straight line, but sometimes a keystone flap will be needed, in which case the scar may be angled.    I discussed the principle behind sentinel lymph node biopsy, what is in, and what surgery consists of.  His back melanoma is likely to map to the left axilla only given the proximity of the upper back lesion to the axilla.    I would have the patient in a sling afterward in order to minimize LUE mobility.   The patient was informed that the skin around the melanoma would be numb.    Risks of surgery include Bleeding Infection Damage to adjacent structures  Wound breakdown. Cancer recurrence  They wish to proceed with surgery.

## 2011-09-23 DIAGNOSIS — L570 Actinic keratosis: Secondary | ICD-10-CM | POA: Diagnosis not present

## 2011-09-24 ENCOUNTER — Encounter: Payer: Self-pay | Admitting: Neurosurgery

## 2011-09-25 ENCOUNTER — Ambulatory Visit: Payer: No Typology Code available for payment source | Admitting: Vascular Surgery

## 2011-09-25 ENCOUNTER — Other Ambulatory Visit: Payer: No Typology Code available for payment source

## 2011-09-25 ENCOUNTER — Ambulatory Visit (INDEPENDENT_AMBULATORY_CARE_PROVIDER_SITE_OTHER): Payer: Medicare Other | Admitting: Vascular Surgery

## 2011-09-25 ENCOUNTER — Encounter: Payer: Self-pay | Admitting: Neurosurgery

## 2011-09-25 ENCOUNTER — Ambulatory Visit (INDEPENDENT_AMBULATORY_CARE_PROVIDER_SITE_OTHER): Payer: Medicare Other | Admitting: Neurosurgery

## 2011-09-25 VITALS — BP 128/60 | HR 56 | Resp 16 | Ht 73.0 in | Wt 149.9 lb

## 2011-09-25 DIAGNOSIS — Z48812 Encounter for surgical aftercare following surgery on the circulatory system: Secondary | ICD-10-CM | POA: Diagnosis not present

## 2011-09-25 DIAGNOSIS — I6529 Occlusion and stenosis of unspecified carotid artery: Secondary | ICD-10-CM

## 2011-09-25 NOTE — Progress Notes (Signed)
[facility] Carotid Office Note  CC: Carotid surveillance Referring Physician: Edilia Bo  History of Present Illness: 78 year old patient of Dr. Edilia Bo who status post a right carotid endarterectomy in February 2013. The patient denies any signs or symptoms of CVA, TIA, amaurosis fugax or any neural deficit. The patient denies any new medical diagnoses or recent surgery.  Past Medical History  Diagnosis Date  . Hypercholesteremia     takes lovastatin daily  . Spinal stenosis   . Hypertension     takes Lisinopril daily  . Spinal stenosis   . Back pain   . Carotid stenosis     right  . GERD (gastroesophageal reflux disease)     takes Omeprazole daily  . Early cataracts, bilateral   . Macular degeneration     dry  . Impaired hearing     ROS: [x]  Positive   [ ]  Denies    General: [ ]  Weight loss, [ ]  Fever, [ ]  chills Neurologic: [ ]  Dizziness, [ ]  Blackouts, [ ]  Seizure [ ]  Stroke, [ ]  "Mini stroke", [ ]  Slurred speech, [ ]  Temporary blindness; [ ]  weakness in arms or legs, [ ]  Hoarseness Cardiac: [ ]  Chest pain/pressure, [ ]  Shortness of breath at rest [ ]  Shortness of breath with exertion, [ ]  Atrial fibrillation or irregular heartbeat Vascular: [ ]  Pain in legs with walking, [ ]  Pain in legs at rest, [ ]  Pain in legs at night,  [ ]  Non-healing ulcer, [ ]  Blood clot in vein/DVT,   Pulmonary: [ ]  Home oxygen, [ ]  Productive cough, [ ]  Coughing up blood, [ ]  Asthma,  [ ]  Wheezing Musculoskeletal:  [ ]  Arthritis, [ ]  Low back pain, [ ]  Joint pain Hematologic: [ ]  Easy Bruising, [ ]  Anemia; [ ]  Hepatitis Gastrointestinal: [ ]  Blood in stool, [ ]  Gastroesophageal Reflux/heartburn, [ ]  Trouble swallowing Urinary: [ ]  chronic Kidney disease, [ ]  on HD - [ ]  MWF or [ ]  TTHS, [ ]  Burning with urination, [ ]  Difficulty urinating Skin: [ ]  Rashes, [ ]  Wounds Psychological: [ ]  Anxiety, [ ]  Depression   Social History History  Substance Use Topics  .  Smoking status: Former Smoker    Types: Cigarettes, Pipe  . Smokeless tobacco: Former Neurosurgeon   Comment: quit 40+yrs ago  . Alcohol Use: 0.6 oz/week    1 Glasses of wine per week     1 glass of wine every night with evening mean    Family History Family History  Problem Relation Age of Onset  . Anesthesia problems Neg Hx   . Hypotension Neg Hx   . Malignant hyperthermia Neg Hx   . Pseudochol deficiency Neg Hx     No Known Allergies  Current Outpatient Prescriptions  Medication Sig Dispense Refill  . aspirin 325 MG tablet Take 325 mg by mouth daily.      . Calcium Carbonate-Vitamin D (CALCIUM 600 + D PO) Take 1 tablet by mouth daily.       . diazepam (VALIUM) 5 MG tablet Take 5 mg by mouth every 6 (six) hours as needed.      Marland Kitchen lisinopril (PRINIVIL,ZESTRIL) 10 MG tablet Take 10 mg by mouth daily.        Marland Kitchen lovastatin (ALTOPREV) 20 MG 24 hr tablet Take 20 mg by mouth at bedtime.        Marland Kitchen omeprazole (PRILOSEC) 20 MG capsule Take 20 mg by mouth daily.        Marland Kitchen  simvastatin (ZOCOR) 20 MG tablet Take 20 mg by mouth Daily.      . vitamin E 400 UNIT capsule Take 400 Units by mouth daily.          Physical Examination  Filed Vitals:   09/25/11 1551  BP: 128/60  Pulse: 56  Resp:     Body mass index is 19.78 kg/(m^2).  General:  WDWN in NAD Gait: Normal HEENT: WNL Eyes: Pupils equal Pulmonary: normal non-labored breathing , without Rales, rhonchi,  wheezing Cardiac: RRR, without  Murmurs, rubs or gallops; Abdomen: soft, NT, no masses Skin: no rashes, ulcers noted  Vascular Exam Pulses: 3+ radial pulses bilaterally Carotid bruits: Carotid pulses to auscultation no bruits are heard Extremities without ischemic changes, no Gangrene , no cellulitis; no open wounds;  Musculoskeletal: no muscle wasting or atrophy   Neurologic: A&O X 3; Appropriate Affect ; SENSATION: normal; MOTOR FUNCTION:  moving all extremities equally. Speech is fluent/normal  Non-Invasive Vascular  Imaging CAROTID DUPLEX 09/25/2011  Right ICA 0 - 19% stenosis Left ICA 20 - 39 % stenosis   ASSESSMENT/PLAN: Asymptomatic patient 6 months status post right CEA and doing well. The patient will followup in 6 months for repeat carotid duplex. The patient's questions were encouraged and answered, he is in agreement with this plan.   Lauree Chandler ANP   Clinic MD: Edilia Bo

## 2011-09-25 NOTE — Progress Notes (Signed)
Carotid duplex performed @ [facility] 09/25/2011 

## 2011-09-27 ENCOUNTER — Encounter (HOSPITAL_BASED_OUTPATIENT_CLINIC_OR_DEPARTMENT_OTHER)
Admission: RE | Admit: 2011-09-27 | Discharge: 2011-09-27 | Disposition: A | Discharge status: Home / Self Care | Payer: Medicare Other | Source: Ambulatory Visit | Attending: General Surgery | Admitting: General Surgery

## 2011-09-27 DIAGNOSIS — Z87891 Personal history of nicotine dependence: Secondary | ICD-10-CM | POA: Diagnosis not present

## 2011-09-27 DIAGNOSIS — E7800 Pure hypercholesterolemia, unspecified: Secondary | ICD-10-CM | POA: Diagnosis not present

## 2011-09-27 DIAGNOSIS — C4359 Malignant melanoma of other part of trunk: Secondary | ICD-10-CM | POA: Diagnosis not present

## 2011-09-27 DIAGNOSIS — Z9089 Acquired absence of other organs: Secondary | ICD-10-CM | POA: Diagnosis not present

## 2011-09-27 DIAGNOSIS — Z7982 Long term (current) use of aspirin: Secondary | ICD-10-CM | POA: Diagnosis not present

## 2011-09-27 DIAGNOSIS — K219 Gastro-esophageal reflux disease without esophagitis: Secondary | ICD-10-CM | POA: Diagnosis not present

## 2011-09-27 DIAGNOSIS — I10 Essential (primary) hypertension: Secondary | ICD-10-CM | POA: Diagnosis not present

## 2011-09-27 LAB — BASIC METABOLIC PANEL
BUN: 15 mg/dL (ref 6–23)
Creatinine, Ser: 0.95 mg/dL (ref 0.50–1.35)
GFR calc Af Amer: 86 mL/min — ABNORMAL LOW (ref >90)
GFR calc non Af Amer: 74 mL/min — ABNORMAL LOW (ref >90)
Potassium: 5.3 mEq/L — ABNORMAL HIGH (ref 3.5–5.1)

## 2011-09-27 NOTE — Progress Notes (Signed)
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

## 2011-10-01 ENCOUNTER — Ambulatory Visit (HOSPITAL_BASED_OUTPATIENT_CLINIC_OR_DEPARTMENT_OTHER)
Admission: RE | Admit: 2011-10-01 | Discharge: 2011-10-01 | Disposition: A | Discharge status: Home / Self Care | Payer: Medicare Other | Source: Ambulatory Visit | Attending: General Surgery | Admitting: General Surgery

## 2011-10-01 ENCOUNTER — Ambulatory Visit (HOSPITAL_BASED_OUTPATIENT_CLINIC_OR_DEPARTMENT_OTHER): Payer: Medicare Other | Admitting: *Deleted

## 2011-10-01 ENCOUNTER — Encounter (HOSPITAL_BASED_OUTPATIENT_CLINIC_OR_DEPARTMENT_OTHER)
Admission: RE | Disposition: A | Discharge status: Home / Self Care | Payer: Self-pay | Source: Ambulatory Visit | Attending: General Surgery

## 2011-10-01 ENCOUNTER — Ambulatory Visit (HOSPITAL_COMMUNITY)
Admission: RE | Admit: 2011-10-01 | Discharge: 2011-10-01 | Disposition: A | Discharge status: Home / Self Care | Payer: Medicare Other | Source: Ambulatory Visit | Attending: General Surgery | Admitting: General Surgery

## 2011-10-01 ENCOUNTER — Encounter (HOSPITAL_BASED_OUTPATIENT_CLINIC_OR_DEPARTMENT_OTHER): Payer: Self-pay | Admitting: *Deleted

## 2011-10-01 ENCOUNTER — Encounter (HOSPITAL_BASED_OUTPATIENT_CLINIC_OR_DEPARTMENT_OTHER): Payer: Self-pay | Admitting: General Surgery

## 2011-10-01 DIAGNOSIS — K219 Gastro-esophageal reflux disease without esophagitis: Secondary | ICD-10-CM | POA: Diagnosis not present

## 2011-10-01 DIAGNOSIS — C801 Malignant (primary) neoplasm, unspecified: Secondary | ICD-10-CM

## 2011-10-01 DIAGNOSIS — E7800 Pure hypercholesterolemia, unspecified: Secondary | ICD-10-CM | POA: Diagnosis not present

## 2011-10-01 DIAGNOSIS — I10 Essential (primary) hypertension: Secondary | ICD-10-CM | POA: Diagnosis not present

## 2011-10-01 DIAGNOSIS — Z7982 Long term (current) use of aspirin: Secondary | ICD-10-CM | POA: Insufficient documentation

## 2011-10-01 DIAGNOSIS — C4359 Malignant melanoma of other part of trunk: Secondary | ICD-10-CM | POA: Insufficient documentation

## 2011-10-01 DIAGNOSIS — Z9089 Acquired absence of other organs: Secondary | ICD-10-CM | POA: Insufficient documentation

## 2011-10-01 DIAGNOSIS — Z87891 Personal history of nicotine dependence: Secondary | ICD-10-CM | POA: Insufficient documentation

## 2011-10-01 DIAGNOSIS — L578 Other skin changes due to chronic exposure to nonionizing radiation: Secondary | ICD-10-CM | POA: Diagnosis not present

## 2011-10-01 HISTORY — DX: Malignant (primary) neoplasm, unspecified: C80.1

## 2011-10-01 LAB — POCT HEMOGLOBIN-HEMACUE: Hemoglobin: 10.6 g/dL — ABNORMAL LOW (ref 13.0–17.0)

## 2011-10-01 SURGERY — EXCISION, MELANOMA, WITH SENTINEL LYMPH NODE BIOPSY
Anesthesia: General | Site: Back | Laterality: Left | Wound class: Clean

## 2011-10-01 MED ORDER — FENTANYL CITRATE 0.05 MG/ML IJ SOLN: 50.0000 ug | INTRAMUSCULAR | Status: DC | PRN

## 2011-10-01 MED ORDER — EPHEDRINE SULFATE 50 MG/ML IJ SOLN
INTRAMUSCULAR | Status: DC | PRN
  Administered 2011-10-01 (×2): 10 mg via INTRAVENOUS

## 2011-10-01 MED ORDER — SODIUM CHLORIDE 0.9 % IV SOLN: 250.0000 mL | INTRAVENOUS | Status: DC | PRN

## 2011-10-01 MED ORDER — BUPIVACAINE HCL (PF) 0.25 % IJ SOLN
INTRAMUSCULAR | Status: DC | PRN
  Administered 2011-10-01: 20 mL

## 2011-10-01 MED ORDER — OXYCODONE HCL 5 MG PO TABS: 5.0000 mg | ORAL_TABLET | ORAL | Status: DC | PRN

## 2011-10-01 MED ORDER — SODIUM CHLORIDE 0.9 % IJ SOLN: 3.0000 mL | Freq: Two times a day (BID) | INTRAMUSCULAR | Status: DC

## 2011-10-01 MED ORDER — TECHNETIUM TC 99M SULFUR COLLOID FILTERED
0.5000 | Freq: Once | INTRAVENOUS | Status: AC | PRN
  Administered 2011-10-01: 0.5 via INTRADERMAL

## 2011-10-01 MED ORDER — PROPOFOL 10 MG/ML IV BOLUS
INTRAVENOUS | Status: DC | PRN
  Administered 2011-10-01: 100 mg via INTRAVENOUS

## 2011-10-01 MED ORDER — PROMETHAZINE HCL 25 MG/ML IJ SOLN: 6.2500 mg | INTRAMUSCULAR | Status: DC | PRN

## 2011-10-01 MED ORDER — SUCCINYLCHOLINE CHLORIDE 20 MG/ML IJ SOLN
INTRAMUSCULAR | Status: DC | PRN
  Administered 2011-10-01: 100 mg via INTRAVENOUS

## 2011-10-01 MED ORDER — HYDROMORPHONE HCL PF 1 MG/ML IJ SOLN
0.2500 mg | INTRAMUSCULAR | Status: DC | PRN
  Administered 2011-10-01 (×2): 0.5 mg via INTRAVENOUS

## 2011-10-01 MED ORDER — SILVER SULFADIAZINE 1 % EX CREA: TOPICAL_CREAM | Freq: Every day | CUTANEOUS | Status: AC

## 2011-10-01 MED ORDER — FENTANYL CITRATE 0.05 MG/ML IJ SOLN
INTRAMUSCULAR | Status: DC | PRN
  Administered 2011-10-01: 50 ug via INTRAVENOUS
  Administered 2011-10-01 (×2): 25 ug via INTRAVENOUS

## 2011-10-01 MED ORDER — ONDANSETRON HCL 4 MG/2ML IJ SOLN
INTRAMUSCULAR | Status: DC | PRN
  Administered 2011-10-01: 4 mg via INTRAVENOUS

## 2011-10-01 MED ORDER — OXYCODONE-ACETAMINOPHEN 5-325 MG PO TABS: 1.0000 | ORAL_TABLET | ORAL | Status: AC | PRN

## 2011-10-01 MED ORDER — LACTATED RINGERS IV SOLN
INTRAVENOUS | Status: DC
  Administered 2011-10-01 (×2): via INTRAVENOUS

## 2011-10-01 MED ORDER — ACETAMINOPHEN 650 MG RE SUPP: 650.0000 mg | RECTAL | Status: DC | PRN

## 2011-10-01 MED ORDER — CEFAZOLIN SODIUM-DEXTROSE 2-3 GM-% IV SOLR
2.0000 g | INTRAVENOUS | Status: AC
  Administered 2011-10-01: 2 g via INTRAVENOUS

## 2011-10-01 MED ORDER — ACETAMINOPHEN 325 MG PO TABS: 650.0000 mg | ORAL_TABLET | ORAL | Status: DC | PRN

## 2011-10-01 MED ORDER — SODIUM CHLORIDE 0.9 % IJ SOLN: 3.0000 mL | INTRAMUSCULAR | Status: DC | PRN

## 2011-10-01 MED ORDER — LIDOCAINE HCL (CARDIAC) 20 MG/ML IV SOLN
INTRAVENOUS | Status: DC | PRN
  Administered 2011-10-01: 75 mg via INTRAVENOUS

## 2011-10-01 MED ORDER — METHYLENE BLUE 1 % INJ SOLN
INTRAMUSCULAR | Status: DC | PRN
  Administered 2011-10-01: 1 mL via INTRADERMAL

## 2011-10-01 MED ORDER — CHLORHEXIDINE GLUCONATE 4 % EX LIQD: 1.0000 "application " | Freq: Once | CUTANEOUS | Status: DC

## 2011-10-01 MED ORDER — MIDAZOLAM HCL 2 MG/2ML IJ SOLN: 1.0000 mg | INTRAMUSCULAR | Status: DC | PRN

## 2011-10-01 MED ORDER — DEXAMETHASONE SODIUM PHOSPHATE 4 MG/ML IJ SOLN
INTRAMUSCULAR | Status: DC | PRN
  Administered 2011-10-01: 4 mg via INTRAVENOUS

## 2011-10-01 MED ORDER — ONDANSETRON HCL 4 MG/2ML IJ SOLN: 4.0000 mg | Freq: Four times a day (QID) | INTRAMUSCULAR | Status: DC | PRN

## 2011-10-01 SURGICAL SUPPLY — 69 items
APL SKNCLS STERI-STRIP NONHPOA (GAUZE/BANDAGES/DRESSINGS) ×2
APPLIER CLIP 11 MED OPEN (CLIP)
APPLIER CLIP 9.375 MED OPEN (MISCELLANEOUS)
APR CLP MED 11 20 MLT OPN (CLIP)
APR CLP MED 9.3 20 MLT OPN (MISCELLANEOUS)
BENZOIN TINCTURE PRP APPL 2/3 (GAUZE/BANDAGES/DRESSINGS) ×2 IMPLANT
BLADE HEX COATED 2.75 (ELECTRODE) ×2 IMPLANT
BLADE SURG 10 STRL SS (BLADE) ×2 IMPLANT
BLADE SURG 15 STRL LF DISP TIS (BLADE) ×1 IMPLANT
BLADE SURG 15 STRL SS (BLADE) ×2
BNDG COHESIVE 4X5 TAN STRL (GAUZE/BANDAGES/DRESSINGS) ×1 IMPLANT
CANISTER SUCTION 1200CC (MISCELLANEOUS) ×2 IMPLANT
CHLORAPREP W/TINT 26ML (MISCELLANEOUS) ×3 IMPLANT
CLIP APPLIE 11 MED OPEN (CLIP) IMPLANT
CLIP APPLIE 9.375 MED OPEN (MISCELLANEOUS) IMPLANT
CLIP TI LARGE 6 (CLIP) ×2 IMPLANT
CLIP TI MEDIUM 6 (CLIP) ×2 IMPLANT
CLIP TI WIDE RED SMALL 6 (CLIP) ×2 IMPLANT
CLOTH BEACON ORANGE TIMEOUT ST (SAFETY) ×2 IMPLANT
COVER MAYO STAND STRL (DRAPES) ×1 IMPLANT
COVER PROBE W GEL 5X96 (DRAPES) ×2 IMPLANT
COVER TABLE BACK 60X90 (DRAPES) IMPLANT
DECANTER SPIKE VIAL GLASS SM (MISCELLANEOUS) ×1 IMPLANT
DRAPE LAPAROSCOPIC ABDOMINAL (DRAPES) ×1 IMPLANT
DRAPE UTILITY XL STRL (DRAPES) ×3 IMPLANT
DRSG PAD ABDOMINAL 8X10 ST (GAUZE/BANDAGES/DRESSINGS) IMPLANT
DRSG TEGADERM 4X10 (GAUZE/BANDAGES/DRESSINGS) ×1 IMPLANT
DRSG TEGADERM 4X4.75 (GAUZE/BANDAGES/DRESSINGS) ×3 IMPLANT
ELECT REM PT RETURN 9FT ADLT (ELECTROSURGICAL) ×2
ELECTRODE REM PT RTRN 9FT ADLT (ELECTROSURGICAL) ×1 IMPLANT
GAUZE SPONGE 4X4 12PLY STRL LF (GAUZE/BANDAGES/DRESSINGS) ×2 IMPLANT
GLOVE BIO SURGEON STRL SZ 6 (GLOVE) ×3 IMPLANT
GLOVE BIOGEL PI IND STRL 6.5 (GLOVE) ×1 IMPLANT
GLOVE BIOGEL PI INDICATOR 6.5 (GLOVE) ×2
GLOVE ECLIPSE 6.5 STRL STRAW (GLOVE) ×1 IMPLANT
GOWN PREVENTION PLUS XLARGE (GOWN DISPOSABLE) ×2 IMPLANT
GOWN PREVENTION PLUS XXLARGE (GOWN DISPOSABLE) ×2 IMPLANT
NDL HYPO 25X1 1.5 SAFETY (NEEDLE) ×2 IMPLANT
NDL SAFETY ECLIPSE 18X1.5 (NEEDLE) ×1 IMPLANT
NEEDLE HYPO 18GX1.5 SHARP (NEEDLE) ×2
NEEDLE HYPO 25X1 1.5 SAFETY (NEEDLE) ×4 IMPLANT
NS IRRIG 1000ML POUR BTL (IV SOLUTION) ×1 IMPLANT
PACK BASIN DAY SURGERY FS (CUSTOM PROCEDURE TRAY) ×2 IMPLANT
PACK UNIVERSAL I (CUSTOM PROCEDURE TRAY) ×1 IMPLANT
PENCIL BUTTON HOLSTER BLD 10FT (ELECTRODE) ×2 IMPLANT
SLEEVE SCD COMPRESS KNEE MED (MISCELLANEOUS) ×2 IMPLANT
SPONGE GAUZE 4X4 12PLY (GAUZE/BANDAGES/DRESSINGS) IMPLANT
SPONGE LAP 18X18 X RAY DECT (DISPOSABLE) ×1 IMPLANT
SPONGE LAP 4X18 X RAY DECT (DISPOSABLE) ×1 IMPLANT
STOCKINETTE IMPERVIOUS LG (DRAPES) ×1 IMPLANT
STRIP CLOSURE SKIN 1/2X4 (GAUZE/BANDAGES/DRESSINGS) ×1 IMPLANT
SUT ETHILON 2 0 FS 18 (SUTURE) ×2 IMPLANT
SUT MNCRL AB 4-0 PS2 18 (SUTURE) ×1 IMPLANT
SUT MON AB 4-0 PC3 18 (SUTURE) ×2 IMPLANT
SUT SILK 2 0 SH (SUTURE) ×1 IMPLANT
SUT VIC AB 2-0 SH 27 (SUTURE) ×4
SUT VIC AB 2-0 SH 27XBRD (SUTURE) IMPLANT
SUT VIC AB 3-0 SH 27 (SUTURE) ×2
SUT VIC AB 3-0 SH 27X BRD (SUTURE) ×1 IMPLANT
SUT VICRYL 3-0 CR8 SH (SUTURE) ×1 IMPLANT
SYR BULB 3OZ (MISCELLANEOUS) ×2 IMPLANT
SYR CONTROL 10ML LL (SYRINGE) ×2 IMPLANT
SYR TB 1ML 26GX3/8 SAFETY (SYRINGE) ×2 IMPLANT
SYR TB 1ML LL NO SAFETY (SYRINGE) ×1 IMPLANT
TOWEL OR 17X24 6PK STRL BLUE (TOWEL DISPOSABLE) ×2 IMPLANT
TOWEL OR NON WOVEN STRL DISP B (DISPOSABLE) ×2 IMPLANT
TUBE CONNECTING 20X1/4 (TUBING) ×2 IMPLANT
WATER STERILE IRR 1000ML POUR (IV SOLUTION) ×1 IMPLANT
YANKAUER SUCT BULB TIP NO VENT (SUCTIONS) ×2 IMPLANT

## 2011-10-01 NOTE — Anesthesia Procedure Notes (Addendum)
Procedure Name: Intubation Date/Time: 10/01/2011 9:52 AM Performed by: Meyer Russel Pre-anesthesia Checklist: Patient identified, Emergency Drugs available, Suction available and Patient being monitored Patient Re-evaluated:Patient Re-evaluated prior to inductionOxygen Delivery Method: Circle System Utilized Preoxygenation: Pre-oxygenation with 100% oxygen Intubation Type: IV induction Ventilation: Mask ventilation without difficulty Grade View: Grade II Tube type: Oral Tube size: 8.0 mm Number of attempts: 1 Airway Equipment and Method: Video-laryngoscopy and Stylet Placement Confirmation: ETT inserted through vocal cords under direct vision,  positive ETCO2 and breath sounds checked- equal and bilateral Secured at: 22 cm Tube secured with: Tape Dental Injury: Teeth and Oropharynx as per pre-operative assessment

## 2011-10-01 NOTE — Transfer of Care (Signed)
Immediate Anesthesia Transfer of Care Note  Patient: Thomas Mccarty.  Procedure(s) Performed: Procedure(s) (LRB): EXCISION MELANOMA WITH SENTINEL LYMPH NODE BIOPSY (Left)  Patient Location: PACU  Anesthesia Type: General  Level of Consciousness: awake and alert   Airway & Oxygen Therapy: Patient Spontanous Breathing and Patient connected to face mask oxygen  Post-op Assessment: Report given to PACU RN, Post -op Vital signs reviewed and stable and Patient moving all extremities  Post vital signs: Reviewed and stable  Complications: No apparent anesthesia complications

## 2011-10-01 NOTE — Interval H&P Note (Signed)
History and Physical Interval Note:  10/01/2011 9:33 AM  Thomas Mccarty.  has presented today for surgery, with the diagnosis of left back melanoma  The various methods of treatment have been discussed with the patient and family. After consideration of risks, benefits and other options for treatment, the patient has consented to  Procedure(s) (LRB): EXCISION MELANOMA WITH SENTINEL LYMPH NODE BIOPSY (Left) as a surgical intervention .  The patient's history has been reviewed, patient examined, no change in status, stable for surgery.  I have reviewed the patient's chart and labs.  Questions were answered to the patient's satisfaction.     Trinda Harlacher

## 2011-10-01 NOTE — Op Note (Signed)
PRE-OPERATIVE DIAGNOSIS: cT3bN0 left back melanoma  POST-OPERATIVE DIAGNOSIS:  Same  PROCEDURE:  Procedure(s): Wide local excision 2 cm margins, advancement flap closure for defect 6 cm x 6 cm, left axillary sentinel lymph node mapping and biopsy  SURGEON:  Surgeon(s): Almond Lint, MD  ANESTHESIA:   local and general  DRAINS: none   LOCAL MEDICATIONS USED:  MARCAINE    and XYLOCAINE   SPECIMEN:  Source of Specimen:  4 axillary sentinel lymph nodes, wide local excision back, additional margins   FINDINGS:  SLN #1 cps 28, #2 palpable, #3 cps 23, blue, #4 cps 137, blue  DISPOSITION OF SPECIMEN:  PATHOLOGY  COUNTS:  YES  PLAN OF CARE: Discharge to home after PACU  PATIENT DISPOSITION:  PACU - hemodynamically stable.    PROCEDURE:   Pt was identified in the holding area, taken to the OR, and placed supine on the OR table.  General anesthesia was induced.  Time out was performed according to the surgical safety checklist.  When all was correct, we continued.  One mL methylene blue was injected intradermally around the melanoma biopsy site.    The patient's left arm and chest were prepped and draped in sterile fashion.  The point of maximum signal intensity was identified with the neoprobe.  A 4 cm incision was made with a #15 blade.  The subcutaneous tissues were divided with the cautery.  A Weitlaner retractor was used to assist with visualization.  The tonsil clamp was used to bluntly dissect the axillary fat pad.  4 sentinel lymph nodes were identified as described above.  The lymphovascular channels were clipped with hemoclips.  The nodes were passed off as specimens.  Hemostasis was achieved with the cautery.  The axilla was irrigated and closed with 3-0 Vicryl deep dermal interrupted sutures and 4-0 Monocryl running subcuticular suture.  This was cleaned, dried, and dressed with Benzoin, steristrips, gauze, and tegaderm.  Counts were correct.  The patient was placed into the right  lateral decubitus position on a beanbag with appropriate padding.  The upper back was prepped and draped in sterile fashion.  The melanoma was identified and 2 cm margins were marked out.  20 mL local was administered under the melanoma and the adjacent tissue.  A #10 blade was used to incise the skin around the melanoma.  The cautery was used to take the dissection down to the fascia.  The skin was marked in situ with silk suture.  The cautery was used to take the specimen off the fascia, and it was passed off the table.    Penetrating towel clips were used to elevate the edges of the incision and the skin was freed up in all directions.  This was pulled together in an oblique orientation. The redundant tissue was taken off both corners to make an ellipse with sharp tissue scissors.  These were marked as well.  The skin was pulled together and held with penetrating towel clips. Deep interrupted 2-0 vicryl sutures were placed to relieve tension.  The skin was then reapproximated with 3-0 interrupted vicryl deep dermal sutures and 4-0 monocryl running subcuticular sutures.  Four 2-0 nylon horizontal mattress sutures were placed as well.  The wound was dressed with Benzoin, steristrips, gauze, and tegaderm.    Needle, sponge, and instrument counts were correct.  The patient was awakened from anesthesia and taken to the PACU in stable condition.

## 2011-10-01 NOTE — Anesthesia Preprocedure Evaluation (Addendum)
Anesthesia Evaluation  Patient identified by MRN, date of birth, ID band Patient awake    Reviewed: Allergy & Precautions, H&P , NPO status , Patient's Chart, lab work & pertinent test results  Airway Mallampati: II TM Distance: >3 FB Neck ROM: full    Dental   Pulmonary    Pulmonary exam normal       Cardiovascular hypertension, On Medications     Neuro/Psych    GI/Hepatic GERD-  Controlled,  Endo/Other    Renal/GU      Musculoskeletal   Abdominal   Peds  Hematology   Anesthesia Other Findings   Reproductive/Obstetrics                           Anesthesia Physical Anesthesia Plan  ASA: III  Anesthesia Plan: General   Post-op Pain Management:    Induction: Intravenous  Airway Management Planned: Oral ETT  Additional Equipment:   Intra-op Plan:   Post-operative Plan: Extubation in OR  Informed Consent: I have reviewed the patients History and Physical, chart, labs and discussed the procedure including the risks, benefits and alternatives for the proposed anesthesia with the patient or authorized representative who has indicated his/her understanding and acceptance.     Plan Discussed with: CRNA and Surgeon  Anesthesia Plan Comments:         Anesthesia Quick Evaluation

## 2011-10-01 NOTE — Progress Notes (Signed)
OOB to recliner chair in phase 2.  Daughter, Angelica Chessman, called and updated on status of patient and informed to come to facility for discharge instructions.  Patient continues to repeat questions about procedure done but is remembering more of details of the day.

## 2011-10-01 NOTE — H&P (View-Only) (Signed)
Chief Complaint  Patient presents with  . Other    eval melanoma upper back    HISTORY: Pt is an 78 year old male who presents with a large left upper back nevus.  He was at the beach, and many people commented on the mole. It was not present or concerning at this last visit with Dr. McConnell several months ago.  He states that it may have itched a little, but he could not see it to note it changing pigment.  Dr. McConnell documented that it was an erythematous hyperkeratotic papule 1 cm in size.  Shave biopsy demonstrated malignant nodular melanoma.  He has history of melanoma and numerous other skin lesions.  His daughter states that he usually gets at least one or two biopsied at every dermatology visit.  He has not felt or seen any other concerning lesions.  He has significant sun exposure.  His skin is fair with freckles.  He burns, then tans.    Past Medical History  Diagnosis Date  . Hypercholesteremia     takes lovastatin daily  . Spinal stenosis   . Hypertension     takes Lisinopril daily  . Spinal stenosis   . Back pain   . Carotid stenosis     right  . GERD (gastroesophageal reflux disease)     takes Omeprazole daily  . Early cataracts, bilateral   . Macular degeneration     dry  . Impaired hearing     Past Surgical History  Procedure Date  . Appendectomy     as a child  . Cholecystectomy approx. 1992  . Endarterectomy 03/19/2011    Procedure: ENDARTERECTOMY CAROTID;  Surgeon: Christopher S Dickson, MD;  Location: [facility] OR;  Service: Vascular;  Laterality: Right;  Right Carotid Endarterectomy with Primary Closure    Current Outpatient Prescriptions  Medication Sig Dispense Refill  . aspirin 325 MG tablet Take 325 mg by mouth daily.      . Calcium Carbonate-Vitamin D (CALCIUM 600 + D PO) Take 1 tablet by mouth daily.       . diazepam (VALIUM) 5 MG tablet Take 5 mg by mouth every 6 (six) hours as needed.      . lisinopril (PRINIVIL,ZESTRIL) 10 MG tablet Take 10 mg by  mouth daily.        . lovastatin (ALTOPREV) 20 MG 24 hr tablet Take 20 mg by mouth at bedtime.        . omeprazole (PRILOSEC) 20 MG capsule Take 20 mg by mouth daily.        . simvastatin (ZOCOR) 20 MG tablet Take 20 mg by mouth Daily.      . vitamin E 400 UNIT capsule Take 400 Units by mouth daily.           No Known Allergies   Family History  Problem Relation Age of Onset  . Anesthesia problems Neg Hx   . Hypotension Neg Hx   . Malignant hyperthermia Neg Hx   . Pseudochol deficiency Neg Hx      History   Social History  . Marital Status: Single    Spouse Name: N/A    Number of Children: N/A  . Years of Education: N/A   Social History Main Topics  . Smoking status: Former Smoker    Types: Cigarettes, Pipe  . Smokeless tobacco: Former User   Comment: quit 40+yrs ago  . Alcohol Use: 0.6 oz/week    1 Glasses of wine per week       1 glass of wine every night with evening mean  . Drug Use: No  . Sexually Active: No     REVIEW OF SYSTEMS - PERTINENT POSITIVES ONLY: 12 point review of systems negative other than HPI and PMH   EXAM: Filed Vitals:   09/17/11 1123  BP: 154/58  Pulse: 68  Temp: 97.2 F (36.2 C)  Resp: 16    Gen:  No acute distress.  Well nourished and well groomed.   Neurological: Alert and oriented to person, place, and time. Coordination normal. Hard of hearing. Head: Normocephalic and atraumatic. Numerous actinic keratoses.   Eyes: Conjunctivae are normal. Pupils are equal, round, and reactive to light. No scleral icterus.  Neck: Normal range of motion. Neck supple. No tracheal deviation or thyromegaly present.  Cardiovascular: Normal rate, regular rhythm, normal heart sounds and intact distal pulses.  Exam reveals no gallop and no friction rub.  No murmur heard. Respiratory: Effort normal.  No respiratory distress. No chest wall tenderness. Breath sounds normal.  No wheezes, rales or rhonchi.  GI: Soft. Bowel sounds are normal. The abdomen is  soft and nontender.  There is no rebound and no guarding.  Musculoskeletal: Normal range of motion. Extremities are nontender.  Lymphadenopathy: No cervical, preauricular, postauricular or axillary adenopathy is present Skin: Skin is warm and dry. No rash noted. No diaphoresis. No erythema. No pallor. No clubbing, cyanosis, or edema.  Many lesions/ scars seen.  None hyperpigmented.  Biopsy site is just below and lateral to angle of scapula.  Biopsy defect is around 1.3 cm.   Psychiatric: Normal mood and affect. Behavior is normal. Judgment and thought content normal.    LABORATORY RESULTS: Available labs are reviewed  Pathology demonstrates Clarks IV 3.35 mm Nodular melanoma.  Ulceration present, 10 mitoses/mm2, no LVI.  Margin close.    RADIOLOGY RESULTS: See E-Chart or I-Site for most recent results.  Images and reports are reviewed. None relevant.   ASSESSMENT AND PLAN: [id number] Melanoma of back Plan wide local excision, advancement flap closure, sentinel lymph node biopsy.  Due to depth and rapidity of growth, will refer to oncology even if SLN negative.    Discussed surgery.   Will need 2 cm margins.  Advised patient and daughter that this may be able to closed in straight line, but sometimes a keystone flap will be needed, in which case the scar may be angled.    I discussed the principle behind sentinel lymph node biopsy, what is in, and what surgery consists of.  His back melanoma is likely to map to the left axilla only given the proximity of the upper back lesion to the axilla.    I would have the patient in a sling afterward in order to minimize LUE mobility.   The patient was informed that the skin around the melanoma would be numb.    Risks of surgery include Bleeding Infection Damage to adjacent structures  Wound breakdown. Cancer recurrence  They wish to proceed with surgery.       Tyreak Reagle L Kimerly Rowand MD Surgical Oncology, General and Endocrine Surgery [facility]      Visit Diagnoses: 1. Melanoma of back     Primary Care Physician: FRIED, ROBERT L, MD    

## 2011-10-01 NOTE — Anesthesia Postprocedure Evaluation (Signed)
  Anesthesia Post-op Note  Patient: Thomas Mccarty.  Procedure(s) Performed: Procedure(s) (LRB): EXCISION MELANOMA WITH SENTINEL LYMPH NODE BIOPSY (Left)  Patient Location: PACU  Anesthesia Type: General  Level of Consciousness: awake and alert   Airway and Oxygen Therapy: Patient Spontanous Breathing  Post-op Pain: mild  Post-op Assessment: Post-op Vital signs reviewed, Patient's Cardiovascular Status Stable, Respiratory Function Stable, Patent Airway, No signs of Nausea or vomiting and Pain level controlled  Post-op Vital Signs: stable  Complications: No apparent anesthesia complications

## 2011-10-02 ENCOUNTER — Telehealth (INDEPENDENT_AMBULATORY_CARE_PROVIDER_SITE_OTHER): Payer: Self-pay

## 2011-10-02 NOTE — Telephone Encounter (Signed)
Called to check on pt after the pt called Dr Ezzard Standing on call last night with some nausea. The pt states he got some nausea medication called in last night and within a hour he was feeling much better. The pt feels much better today. I made a f/u appt with Dr Donell Beers for his postop appt.

## 2011-10-09 ENCOUNTER — Telehealth (INDEPENDENT_AMBULATORY_CARE_PROVIDER_SITE_OTHER): Payer: Self-pay

## 2011-10-09 NOTE — Telephone Encounter (Signed)
LMOV for pt to call to receive pathology results.

## 2011-10-10 NOTE — Telephone Encounter (Signed)
LM again for pt to call for his pathology results.

## 2011-10-11 ENCOUNTER — Telehealth (INDEPENDENT_AMBULATORY_CARE_PROVIDER_SITE_OTHER): Payer: Self-pay | Admitting: General Surgery

## 2011-10-11 ENCOUNTER — Encounter (INDEPENDENT_AMBULATORY_CARE_PROVIDER_SITE_OTHER): Payer: Self-pay | Admitting: General Surgery

## 2011-10-11 ENCOUNTER — Other Ambulatory Visit (INDEPENDENT_AMBULATORY_CARE_PROVIDER_SITE_OTHER): Payer: Self-pay | Admitting: General Surgery

## 2011-10-11 ENCOUNTER — Ambulatory Visit (INDEPENDENT_AMBULATORY_CARE_PROVIDER_SITE_OTHER): Payer: Medicare Other | Admitting: General Surgery

## 2011-10-11 VITALS — BP 146/60 | HR 72 | Temp 97.2°F | Resp 18 | Ht 73.0 in | Wt 146.4 lb

## 2011-10-11 DIAGNOSIS — C4359 Malignant melanoma of other part of trunk: Secondary | ICD-10-CM

## 2011-10-11 DIAGNOSIS — C439 Malignant melanoma of skin, unspecified: Secondary | ICD-10-CM

## 2011-10-11 NOTE — Assessment & Plan Note (Signed)
Pt with good wound healing post op on shoulder.  Follow up in 3 months.  Will refer to wound clinic due to continued difficulty healing wounds from cryotherapy of pretibial skin lesions.

## 2011-10-11 NOTE — Telephone Encounter (Signed)
Discussed path with daughter.

## 2011-10-11 NOTE — Progress Notes (Signed)
HISTORY: The patient is doing well status post wide local excision and sentinel lymph node biopsy from left posterior shoulder melanoma. He is not having any pain. He is not taking any narcotics. He did not get the prescription filled. He has good mobility of the arm. His main complaints are continued nonhealing some shin lesions with some cryotherapy at his dermatology office.    EXAM: General:  Alert and oriented Incision:  Healing well.  Sutures removed, steristrips replaced.     PATHOLOGY: Diagnosis 1. Lymph node, sentinel, biopsy, Left axillary - THERE IS NO EVIDENCE OF METASTATIC MALIGNANT MELANOMA IN 1 OF 1 LYMPH NODE (0/1). - SEE COMMENT. 2. Lymph node, sentinel, biopsy, Left axillary - MELANOCYTIC NEVUS REST. - THERE IS NO EVIDENCE OF METASTATIC MALIGNANT MELANOMA IN 1 OF 1 LYMPH NODE (0/1). - SEE COMMENT. 3. Lymph node, sentinel, biopsy, Left axillary - THERE IS NO EVIDENCE OF METASTATIC MALIGNANT MELANOMA IN 1 OF 1 LYMPH NODE (0/1). - SEE COMMENT. 4. Lymph node, sentinel, biopsy, Left axillary - THERE IS NO EVIDENCE OF METASTATIC MALIGNANT MELANOMA IN 1 OF 1 LYMPH NODE (0/1). - SEE COMMENT. 5. Skin , Left back, wide excision - SCAR, CONSISTENT WITH PRIOR SURGICAL PROCEDURE. - SOLAR ELASTOSIS. - THERE IS NO EVIDENCE OF MALIGNANCY. 6. Skin , Left back, lateral margin - SOLAR ELASTOSIS. - THERE IS NO EVIDENCE OF MALIGNANCY. 7. Skin , Left back, medial margin - SOLAR ELASTOSIS. - THERE IS NO EVIDENCE OF MALIGNANCY.    ASSESSMENT AND PLAN:   Melanoma of back Pt with good wound healing post op on shoulder.  Follow up in 3 months.  Will refer to wound clinic due to continued difficulty healing wounds from cryotherapy of pretibial skin lesions.        Maudry Diego, MD Surgical Oncology, General & Endocrine Surgery [facility]  Lenora Boys, MD Lenora Boys, MD

## 2011-10-11 NOTE — Patient Instructions (Signed)
OK to get in salt water to waist as soon as now.  OK to get wounds in salt water after September 10.    We will refer to the wound center to look at leg wounds.  OK to delay until after beach trip.  Follow up with me in 3 months.

## 2011-10-23 ENCOUNTER — Encounter (INDEPENDENT_AMBULATORY_CARE_PROVIDER_SITE_OTHER): Payer: Self-pay

## 2011-11-04 DIAGNOSIS — Z85820 Personal history of malignant melanoma of skin: Secondary | ICD-10-CM | POA: Diagnosis not present

## 2011-11-04 DIAGNOSIS — L259 Unspecified contact dermatitis, unspecified cause: Secondary | ICD-10-CM | POA: Diagnosis not present

## 2011-11-08 DIAGNOSIS — H3530 Unspecified macular degeneration: Secondary | ICD-10-CM | POA: Diagnosis not present

## 2011-11-08 DIAGNOSIS — D3100 Benign neoplasm of unspecified conjunctiva: Secondary | ICD-10-CM | POA: Diagnosis not present

## 2011-11-08 DIAGNOSIS — H35369 Drusen (degenerative) of macula, unspecified eye: Secondary | ICD-10-CM | POA: Diagnosis not present

## 2011-11-19 ENCOUNTER — Other Ambulatory Visit: Payer: Self-pay | Admitting: *Deleted

## 2011-11-19 DIAGNOSIS — Z48812 Encounter for surgical aftercare following surgery on the circulatory system: Secondary | ICD-10-CM

## 2011-11-19 DIAGNOSIS — I6529 Occlusion and stenosis of unspecified carotid artery: Secondary | ICD-10-CM

## 2011-11-25 DIAGNOSIS — G479 Sleep disorder, unspecified: Secondary | ICD-10-CM | POA: Diagnosis not present

## 2011-11-25 DIAGNOSIS — I779 Disorder of arteries and arterioles, unspecified: Secondary | ICD-10-CM | POA: Diagnosis not present

## 2011-11-25 DIAGNOSIS — Z136 Encounter for screening for cardiovascular disorders: Secondary | ICD-10-CM | POA: Diagnosis not present

## 2011-11-25 DIAGNOSIS — E7800 Pure hypercholesterolemia, unspecified: Secondary | ICD-10-CM | POA: Diagnosis not present

## 2011-11-25 DIAGNOSIS — K219 Gastro-esophageal reflux disease without esophagitis: Secondary | ICD-10-CM | POA: Diagnosis not present

## 2011-11-25 DIAGNOSIS — I10 Essential (primary) hypertension: Secondary | ICD-10-CM | POA: Diagnosis not present

## 2011-12-09 DIAGNOSIS — K219 Gastro-esophageal reflux disease without esophagitis: Secondary | ICD-10-CM | POA: Diagnosis not present

## 2011-12-09 DIAGNOSIS — E7800 Pure hypercholesterolemia, unspecified: Secondary | ICD-10-CM | POA: Diagnosis not present

## 2011-12-09 DIAGNOSIS — I779 Disorder of arteries and arterioles, unspecified: Secondary | ICD-10-CM | POA: Diagnosis not present

## 2011-12-09 DIAGNOSIS — I10 Essential (primary) hypertension: Secondary | ICD-10-CM | POA: Diagnosis not present

## 2011-12-31 DIAGNOSIS — H2510 Age-related nuclear cataract, unspecified eye: Secondary | ICD-10-CM | POA: Diagnosis not present

## 2012-01-16 DIAGNOSIS — H269 Unspecified cataract: Secondary | ICD-10-CM | POA: Diagnosis not present

## 2012-01-16 DIAGNOSIS — H2510 Age-related nuclear cataract, unspecified eye: Secondary | ICD-10-CM | POA: Diagnosis not present

## 2012-02-17 DIAGNOSIS — L570 Actinic keratosis: Secondary | ICD-10-CM | POA: Diagnosis not present

## 2012-02-17 DIAGNOSIS — C44721 Squamous cell carcinoma of skin of unspecified lower limb, including hip: Secondary | ICD-10-CM | POA: Diagnosis not present

## 2012-02-17 DIAGNOSIS — L259 Unspecified contact dermatitis, unspecified cause: Secondary | ICD-10-CM | POA: Diagnosis not present

## 2012-02-20 DIAGNOSIS — R42 Dizziness and giddiness: Secondary | ICD-10-CM | POA: Diagnosis not present

## 2012-03-26 ENCOUNTER — Encounter: Payer: Self-pay | Admitting: Neurosurgery

## 2012-03-27 ENCOUNTER — Ambulatory Visit: Payer: Self-pay | Admitting: Neurosurgery

## 2012-03-27 ENCOUNTER — Ambulatory Visit: Payer: No Typology Code available for payment source | Admitting: Neurosurgery

## 2012-03-27 ENCOUNTER — Other Ambulatory Visit: Payer: Self-pay

## 2012-03-27 ENCOUNTER — Other Ambulatory Visit: Payer: No Typology Code available for payment source

## 2012-04-23 DIAGNOSIS — G4700 Insomnia, unspecified: Secondary | ICD-10-CM | POA: Diagnosis not present

## 2012-04-23 DIAGNOSIS — L0201 Cutaneous abscess of face: Secondary | ICD-10-CM | POA: Diagnosis not present

## 2012-04-23 DIAGNOSIS — L03211 Cellulitis of face: Secondary | ICD-10-CM | POA: Diagnosis not present

## 2012-05-20 DIAGNOSIS — Z85820 Personal history of malignant melanoma of skin: Secondary | ICD-10-CM | POA: Diagnosis not present

## 2012-05-20 DIAGNOSIS — L259 Unspecified contact dermatitis, unspecified cause: Secondary | ICD-10-CM | POA: Diagnosis not present

## 2012-05-29 DIAGNOSIS — Z77120 Contact with and (suspected) exposure to mold (toxic): Secondary | ICD-10-CM | POA: Diagnosis not present

## 2012-06-02 DIAGNOSIS — H3530 Unspecified macular degeneration: Secondary | ICD-10-CM | POA: Diagnosis not present

## 2012-06-02 DIAGNOSIS — H35369 Drusen (degenerative) of macula, unspecified eye: Secondary | ICD-10-CM | POA: Diagnosis not present

## 2012-06-04 DIAGNOSIS — D235 Other benign neoplasm of skin of trunk: Secondary | ICD-10-CM | POA: Diagnosis not present

## 2012-06-04 DIAGNOSIS — C44319 Basal cell carcinoma of skin of other parts of face: Secondary | ICD-10-CM | POA: Diagnosis not present

## 2012-06-04 DIAGNOSIS — Z85820 Personal history of malignant melanoma of skin: Secondary | ICD-10-CM | POA: Diagnosis not present

## 2012-06-11 DIAGNOSIS — Z85828 Personal history of other malignant neoplasm of skin: Secondary | ICD-10-CM | POA: Diagnosis not present

## 2012-06-23 DIAGNOSIS — L03319 Cellulitis of trunk, unspecified: Secondary | ICD-10-CM | POA: Diagnosis not present

## 2012-06-23 DIAGNOSIS — L02219 Cutaneous abscess of trunk, unspecified: Secondary | ICD-10-CM | POA: Diagnosis not present

## 2012-07-17 DIAGNOSIS — C44721 Squamous cell carcinoma of skin of unspecified lower limb, including hip: Secondary | ICD-10-CM | POA: Diagnosis not present

## 2012-07-17 DIAGNOSIS — Z85820 Personal history of malignant melanoma of skin: Secondary | ICD-10-CM | POA: Diagnosis not present

## 2012-11-03 DIAGNOSIS — E7800 Pure hypercholesterolemia, unspecified: Secondary | ICD-10-CM | POA: Diagnosis not present

## 2012-11-03 DIAGNOSIS — L989 Disorder of the skin and subcutaneous tissue, unspecified: Secondary | ICD-10-CM | POA: Diagnosis not present

## 2012-11-03 DIAGNOSIS — I10 Essential (primary) hypertension: Secondary | ICD-10-CM | POA: Diagnosis not present

## 2012-11-03 DIAGNOSIS — G4700 Insomnia, unspecified: Secondary | ICD-10-CM | POA: Diagnosis not present

## 2012-11-12 DIAGNOSIS — Z85820 Personal history of malignant melanoma of skin: Secondary | ICD-10-CM | POA: Diagnosis not present

## 2012-11-12 DIAGNOSIS — D235 Other benign neoplasm of skin of trunk: Secondary | ICD-10-CM | POA: Diagnosis not present

## 2012-11-12 DIAGNOSIS — L570 Actinic keratosis: Secondary | ICD-10-CM | POA: Diagnosis not present

## 2012-11-12 DIAGNOSIS — C4359 Malignant melanoma of other part of trunk: Secondary | ICD-10-CM | POA: Diagnosis not present

## 2012-11-23 ENCOUNTER — Encounter (INDEPENDENT_AMBULATORY_CARE_PROVIDER_SITE_OTHER): Payer: Self-pay

## 2012-11-27 ENCOUNTER — Ambulatory Visit (INDEPENDENT_AMBULATORY_CARE_PROVIDER_SITE_OTHER): Payer: Medicare Other | Admitting: General Surgery

## 2012-11-27 VITALS — BP 130/70 | HR 66 | Ht 74.0 in | Wt 151.0 lb

## 2012-11-27 DIAGNOSIS — C4359 Malignant melanoma of other part of trunk: Secondary | ICD-10-CM

## 2012-11-27 NOTE — Patient Instructions (Signed)
We will plan Wide local excision of right back melanoma.    We will do numbing medicine and sedation instead of general anesthesia.    Main risks are bleeding, infection, and damage to adjacent structures.  Most people have some numbness at the skin site.

## 2012-12-01 ENCOUNTER — Encounter (HOSPITAL_BASED_OUTPATIENT_CLINIC_OR_DEPARTMENT_OTHER): Payer: Self-pay | Admitting: *Deleted

## 2012-12-02 ENCOUNTER — Encounter (HOSPITAL_BASED_OUTPATIENT_CLINIC_OR_DEPARTMENT_OTHER): Payer: Self-pay | Admitting: *Deleted

## 2012-12-02 NOTE — Progress Notes (Signed)
Did talk with pt-he will come in for [id number]

## 2012-12-02 NOTE — Progress Notes (Signed)
Talked with daughter-pt very HOH-cannot use hearing aids-all 3 children live out of town-daughter julie will come

## 2012-12-03 ENCOUNTER — Other Ambulatory Visit: Payer: Self-pay

## 2012-12-03 ENCOUNTER — Encounter (HOSPITAL_BASED_OUTPATIENT_CLINIC_OR_DEPARTMENT_OTHER)
Admission: RE | Admit: 2012-12-03 | Discharge: 2012-12-03 | Disposition: A | Discharge status: Home / Self Care | Payer: Medicare Other | Source: Ambulatory Visit | Attending: General Surgery | Admitting: General Surgery

## 2012-12-03 DIAGNOSIS — Z01818 Encounter for other preprocedural examination: Secondary | ICD-10-CM | POA: Diagnosis not present

## 2012-12-03 DIAGNOSIS — Z01810 Encounter for preprocedural cardiovascular examination: Secondary | ICD-10-CM | POA: Insufficient documentation

## 2012-12-03 DIAGNOSIS — Z01812 Encounter for preprocedural laboratory examination: Secondary | ICD-10-CM | POA: Diagnosis not present

## 2012-12-03 LAB — BASIC METABOLIC PANEL
BUN: 12 mg/dL (ref 6–23)
Creatinine, Ser: 0.8 mg/dL (ref 0.50–1.35)
GFR calc Af Amer: >90 mL/min (ref >90)
GFR calc non Af Amer: 79 mL/min — ABNORMAL LOW (ref >90)

## 2012-12-04 ENCOUNTER — Telehealth (INDEPENDENT_AMBULATORY_CARE_PROVIDER_SITE_OTHER): Payer: Self-pay | Admitting: General Surgery

## 2012-12-04 NOTE — Telephone Encounter (Signed)
Pt's daughter, Tyqwan Pink, called to speak with Dr. Donell Beers about the need for lymph node bx and anesthesia plans.  She states she works with oncologists and is "going on information" from sibling that came to office appt with their father.  Wants to talk with Dr. Donell Beers on Monday, as surgery is scheduled for Tues.

## 2012-12-06 NOTE — Progress Notes (Signed)
Chief Complaint:  Right back melanoma.   HISTORY: Pt is an 79 yo M who is around 1 year s/p WLE and SLN bx left back melanoma.  Dr. Hall found a new melanoma on his right back at his last checkup.  He presents for excision.  This is far enough away that it does not appear to be an in transit metastasis or a satellite lesion.  He denies pain.  He had a shave biopsy that demonstrated 0.77 mm depth with 1 mitosis per high powered field.    Past Medical History  Diagnosis Date  . Hypercholesteremia     takes lovastatin daily  . Spinal stenosis   . Hypertension     takes Lisinopril daily  . Spinal stenosis   . Back pain   . Carotid stenosis     right  . GERD (gastroesophageal reflux disease)     takes Omeprazole daily  . Early cataracts, bilateral   . Macular degeneration     dry  . Impaired hearing   . Cancer 10/01/11    Back - Melanoma  . Dysuria   . Keratosis   . Complication of anesthesia     has severe memory loss a week post op 8/13    Past Surgical History  Procedure Laterality Date  . Appendectomy      as a child  . Cholecystectomy  approx. 1992  . Endarterectomy  03/19/2011    Procedure: ENDARTERECTOMY CAROTID;  Surgeon: Christopher S Dickson, MD;  Location: [facility] OR;  Service: Vascular;  Laterality: Right;  Right Carotid Endarterectomy with Primary Closure  . Carotid endarterectomy  03/19/11    RIGHT  cea  . Melanoma excision with sentinel lymph node biopsy  8/13    melanoma removed back with skin graft-snbx lt axillary    Current Outpatient Prescriptions  Medication Sig Dispense Refill  . aspirin 325 MG tablet Take 325 mg by mouth daily.      . Calcium Carbonate-Vitamin D (CALCIUM 600 + D PO) Take 1 tablet by mouth daily.       . diazepam (VALIUM) 5 MG tablet Take 5 mg by mouth every 6 (six) hours as needed.      . lisinopril (PRINIVIL,ZESTRIL) 10 MG tablet Take 10 mg by mouth daily.       . lovastatin (ALTOPREV) 20 MG 24 hr tablet Take 20 mg by mouth at bedtime.        . omeprazole (PRILOSEC) 20 MG capsule Take 20 mg by mouth daily.        . simvastatin (ZOCOR) 20 MG tablet Take 20 mg by mouth daily.       . vitamin E 400 UNIT capsule Take 400 Units by mouth daily.         No current facility-administered medications for this visit.     Allergies  Allergen Reactions  . Codeine      Family History  Problem Relation Age of Onset  . Anesthesia problems Neg Hx   . Hypotension Neg Hx   . Malignant hyperthermia Neg Hx   . Pseudochol deficiency Neg Hx   . Ulcers Mother      History   Social History  . Marital Status: Single    Spouse Name: N/A    Number of Children: N/A  . Years of Education: N/A   Social History Main Topics  . Smoking status: Former Smoker    Types: Cigarettes, Pipe    Quit date: 12/03/1962  . Smokeless tobacco:   Former User     Comment: quit 40+yrs ago  . Alcohol Use: 0.6 oz/week    1 Glasses of wine per week     Comment: 1 glass of wine every night with evening mean  . Drug Use: No  . Sexual Activity: No    REVIEW OF SYSTEMS - PERTINENT POSITIVES ONLY: 12 point review of systems negative other than HPI and PMH except for confusion/dementia  EXAM: Filed Vitals:   12/06/12 1448  BP: 130/70  Pulse: 66   Filed Weights   12/06/12 1448  Weight: 151 lb (68.493 kg)     Gen:  No acute distress.  Very thin.   Neurological: Confused about situation.  Repetitive questioning.   Hard of hearing.   Head: Normocephalic and atraumatic.  Eyes: Conjunctivae are normal. Pupils are equal, round, and reactive to light. No scleral icterus.  Neck: Normal range of motion. Neck supple. No tracheal deviation or thyromegaly present.  Cardiovascular: Normal rate, regular rhythm, normal heart sounds and intact distal pulses.  Exam reveals no gallop and no friction rub.  No murmur heard. Respiratory: Effort normal.  No respiratory distress. No chest wall tenderness. Breath sounds normal.  No wheezes, rales or rhonchi.  GI: Soft.  Bowel sounds are normal. The abdomen is soft and nontender.  There is no rebound and no guarding.  Musculoskeletal: Kyphotic.  Slow to move.    Lymphadenopathy: No cervical, preauricular, postauricular or axillary adenopathy is present Skin: Skin is warm and dry. No rash noted. No diaphoresis. No erythema. No pallor. No clubbing, cyanosis, or edema.  No lesions on left scar.  Right scar without residual pigment.  Psychiatric: Normal mood and affect. Behavior is normal. Judgment and thought content normal.    LABORATORY RESULTS: Available labs are reviewed  Path  Lentigo maligna melanoma.  0.77 mm breslow thickness.  No ulceration, deep margin free, lateral margins with atypical melanocytic hyperplasia, no LVI.  + TIL, no regression, 1 mitosis/per HPF.    RADIOLOGY RESULTS: See E-Chart or I-Site for most recent results.  Images and reports are reviewed.  n/a  ASSESSMENT AND PLAN: Melanoma of back Pt has new back melanoma on opposite side.   This would classically require a SLN biopsy due to presence of mitoses.  The patient was offered SLN biopsy.    However, the patient is 87, and would not be candidate for adjuvant interferon even if positive.  The patient had significant issues with anesthesia last time.  We will avoid general anesthesia this time and just do wide local excision with 1 cm margins.    The daughter is in agreement with this plan.        Roniya Tetro L Louana Fontenot MD Surgical Oncology, General and Endocrine Surgery [facility]      Visit Diagnoses: 1. Melanoma of back     Primary Care Physician: FRIED, ROBERT L, MD    

## 2012-12-06 NOTE — Assessment & Plan Note (Addendum)
Pt has new back melanoma on opposite side.   This would classically require a SLN biopsy due to presence of mitoses.  The patient was offered SLN biopsy.    However, the patient is 87, and would not be candidate for adjuvant interferon even if positive.  The patient had significant issues with anesthesia last time.  We will avoid general anesthesia this time and just do wide local excision with 1 cm margins.    The daughter is in agreement with this plan.

## 2012-12-08 ENCOUNTER — Telehealth (INDEPENDENT_AMBULATORY_CARE_PROVIDER_SITE_OTHER): Payer: Self-pay | Admitting: General Surgery

## 2012-12-08 ENCOUNTER — Ambulatory Visit (HOSPITAL_BASED_OUTPATIENT_CLINIC_OR_DEPARTMENT_OTHER)
Admission: RE | Admit: 2012-12-08 | Discharge: 2012-12-08 | Disposition: A | Discharge status: Home / Self Care | Payer: Medicare Other | Source: Ambulatory Visit | Attending: General Surgery | Admitting: General Surgery

## 2012-12-08 ENCOUNTER — Encounter (HOSPITAL_BASED_OUTPATIENT_CLINIC_OR_DEPARTMENT_OTHER): Payer: Medicare Other | Admitting: Anesthesiology

## 2012-12-08 ENCOUNTER — Ambulatory Visit (HOSPITAL_BASED_OUTPATIENT_CLINIC_OR_DEPARTMENT_OTHER): Payer: Medicare Other | Admitting: Anesthesiology

## 2012-12-08 ENCOUNTER — Encounter (HOSPITAL_BASED_OUTPATIENT_CLINIC_OR_DEPARTMENT_OTHER): Payer: Self-pay

## 2012-12-08 ENCOUNTER — Encounter (HOSPITAL_BASED_OUTPATIENT_CLINIC_OR_DEPARTMENT_OTHER)
Admission: RE | Disposition: A | Discharge status: Home / Self Care | Payer: Self-pay | Source: Ambulatory Visit | Attending: General Surgery

## 2012-12-08 DIAGNOSIS — L819 Disorder of pigmentation, unspecified: Secondary | ICD-10-CM | POA: Insufficient documentation

## 2012-12-08 DIAGNOSIS — L98499 Non-pressure chronic ulcer of skin of other sites with unspecified severity: Secondary | ICD-10-CM | POA: Insufficient documentation

## 2012-12-08 DIAGNOSIS — H3530 Unspecified macular degeneration: Secondary | ICD-10-CM | POA: Diagnosis not present

## 2012-12-08 DIAGNOSIS — L578 Other skin changes due to chronic exposure to nonionizing radiation: Secondary | ICD-10-CM | POA: Insufficient documentation

## 2012-12-08 DIAGNOSIS — C4359 Malignant melanoma of other part of trunk: Secondary | ICD-10-CM

## 2012-12-08 DIAGNOSIS — M4800 Spinal stenosis, site unspecified: Secondary | ICD-10-CM | POA: Diagnosis not present

## 2012-12-08 DIAGNOSIS — H269 Unspecified cataract: Secondary | ICD-10-CM | POA: Diagnosis not present

## 2012-12-08 DIAGNOSIS — I6529 Occlusion and stenosis of unspecified carotid artery: Secondary | ICD-10-CM | POA: Insufficient documentation

## 2012-12-08 DIAGNOSIS — H9190 Unspecified hearing loss, unspecified ear: Secondary | ICD-10-CM | POA: Insufficient documentation

## 2012-12-08 DIAGNOSIS — K219 Gastro-esophageal reflux disease without esophagitis: Secondary | ICD-10-CM | POA: Diagnosis not present

## 2012-12-08 DIAGNOSIS — I10 Essential (primary) hypertension: Secondary | ICD-10-CM | POA: Insufficient documentation

## 2012-12-08 HISTORY — PX: MELANOMA EXCISION: SHX5266

## 2012-12-08 HISTORY — DX: Adverse effect of unspecified anesthetic, initial encounter: T41.45XA

## 2012-12-08 HISTORY — DX: Other complications of anesthesia, initial encounter: T88.59XA

## 2012-12-08 LAB — POCT HEMOGLOBIN-HEMACUE: Hemoglobin: 14.5 g/dL (ref 13.0–17.0)

## 2012-12-08 SURGERY — EXCISION, MELANOMA
Anesthesia: Monitor Anesthesia Care | Site: Back | Laterality: Right | Wound class: Clean

## 2012-12-08 MED ORDER — SODIUM CHLORIDE 0.9 % IV SOLN
250.0000 mL | INTRAVENOUS | Status: DC | PRN
Start: 2012-12-08 — End: 2012-12-08

## 2012-12-08 MED ORDER — ONDANSETRON HCL 4 MG/2ML IJ SOLN
INTRAMUSCULAR | Status: DC | PRN
  Administered 2012-12-08: 4 mg via INTRAVENOUS

## 2012-12-08 MED ORDER — HYDROCODONE-ACETAMINOPHEN 5-325 MG PO TABS: 1.0000 | ORAL_TABLET | ORAL | Status: DC | PRN

## 2012-12-08 MED ORDER — EPHEDRINE SULFATE 50 MG/ML IJ SOLN
INTRAMUSCULAR | Status: DC | PRN
  Administered 2012-12-08: 15 mg via INTRAVENOUS

## 2012-12-08 MED ORDER — OXYCODONE HCL 5 MG PO TABS: 5.0000 mg | ORAL_TABLET | ORAL | Status: DC | PRN

## 2012-12-08 MED ORDER — FENTANYL CITRATE 0.05 MG/ML IJ SOLN
INTRAMUSCULAR | Status: AC
  Filled 2012-12-08: qty 4

## 2012-12-08 MED ORDER — BUPIVACAINE HCL (PF) 0.25 % IJ SOLN
INTRAMUSCULAR | Status: AC
  Filled 2012-12-08: qty 30

## 2012-12-08 MED ORDER — LIDOCAINE HCL (PF) 1 % IJ SOLN
INTRAMUSCULAR | Status: AC
  Filled 2012-12-08: qty 30

## 2012-12-08 MED ORDER — CHLORHEXIDINE GLUCONATE 4 % EX LIQD: 1.0000 "application " | Freq: Once | CUTANEOUS | Status: DC

## 2012-12-08 MED ORDER — ACETAMINOPHEN 325 MG PO TABS: 650.0000 mg | ORAL_TABLET | ORAL | Status: DC | PRN

## 2012-12-08 MED ORDER — PROPOFOL 10 MG/ML IV EMUL
INTRAVENOUS | Status: AC
  Filled 2012-12-08: qty 50

## 2012-12-08 MED ORDER — MIDAZOLAM HCL 2 MG/2ML IJ SOLN
INTRAMUSCULAR | Status: AC
  Filled 2012-12-08: qty 2

## 2012-12-08 MED ORDER — CEFAZOLIN SODIUM-DEXTROSE 2-3 GM-% IV SOLR
INTRAVENOUS | Status: AC
  Filled 2012-12-08: qty 50

## 2012-12-08 MED ORDER — PROPOFOL 10 MG/ML IV BOLUS
INTRAVENOUS | Status: DC | PRN
  Administered 2012-12-08 (×3): 20 mg via INTRAVENOUS

## 2012-12-08 MED ORDER — ONDANSETRON HCL 4 MG/2ML IJ SOLN: 4.0000 mg | Freq: Four times a day (QID) | INTRAMUSCULAR | Status: DC | PRN

## 2012-12-08 MED ORDER — SODIUM CHLORIDE 0.9 % IJ SOLN: 3.0000 mL | INTRAMUSCULAR | Status: DC | PRN

## 2012-12-08 MED ORDER — ONDANSETRON HCL 4 MG PO TABS: 4.0000 mg | ORAL_TABLET | Freq: Four times a day (QID) | ORAL | Status: DC | PRN

## 2012-12-08 MED ORDER — FENTANYL CITRATE 0.05 MG/ML IJ SOLN: 50.0000 ug | INTRAMUSCULAR | Status: DC | PRN

## 2012-12-08 MED ORDER — BUPIVACAINE-EPINEPHRINE PF 0.25-1:200000 % IJ SOLN
INTRAMUSCULAR | Status: AC
  Filled 2012-12-08: qty 30

## 2012-12-08 MED ORDER — ACETAMINOPHEN 650 MG RE SUPP: 650.0000 mg | RECTAL | Status: DC | PRN

## 2012-12-08 MED ORDER — SODIUM CHLORIDE 0.9 % IJ SOLN: 3.0000 mL | Freq: Two times a day (BID) | INTRAMUSCULAR | Status: DC

## 2012-12-08 MED ORDER — MIDAZOLAM HCL 2 MG/2ML IJ SOLN: 1.0000 mg | INTRAMUSCULAR | Status: DC | PRN

## 2012-12-08 MED ORDER — LIDOCAINE HCL 1 % IJ SOLN
INTRAMUSCULAR | Status: DC | PRN
  Administered 2012-12-08: 12:00:00

## 2012-12-08 MED ORDER — FENTANYL CITRATE 0.05 MG/ML IJ SOLN
INTRAMUSCULAR | Status: DC | PRN
  Administered 2012-12-08 (×2): 25 ug via INTRAVENOUS

## 2012-12-08 MED ORDER — CEFAZOLIN SODIUM-DEXTROSE 2-3 GM-% IV SOLR: 2.0000 g | INTRAVENOUS | Status: DC

## 2012-12-08 MED ORDER — LACTATED RINGERS IV SOLN
INTRAVENOUS | Status: DC
  Administered 2012-12-08: 11:00:00 via INTRAVENOUS

## 2012-12-08 SURGICAL SUPPLY — 53 items
ADH SKN CLS APL DERMABOND .7 (GAUZE/BANDAGES/DRESSINGS)
APL SKNCLS STERI-STRIP NONHPOA (GAUZE/BANDAGES/DRESSINGS) ×1
BENZOIN TINCTURE PRP APPL 2/3 (GAUZE/BANDAGES/DRESSINGS) ×1 IMPLANT
BLADE HEX COATED 2.75 (ELECTRODE) ×2 IMPLANT
BLADE SURG 15 STRL LF DISP TIS (BLADE) ×1 IMPLANT
BLADE SURG 15 STRL SS (BLADE) ×2
CANISTER SUCT 1200ML W/VALVE (MISCELLANEOUS) IMPLANT
CHLORAPREP W/TINT 26ML (MISCELLANEOUS) ×2 IMPLANT
COVER MAYO STAND STRL (DRAPES) ×1 IMPLANT
COVER TABLE BACK 60X90 (DRAPES) ×1 IMPLANT
DECANTER SPIKE VIAL GLASS SM (MISCELLANEOUS) IMPLANT
DERMABOND ADVANCED (GAUZE/BANDAGES/DRESSINGS)
DERMABOND ADVANCED .7 DNX12 (GAUZE/BANDAGES/DRESSINGS) ×1 IMPLANT
DRAPE PED LAPAROTOMY (DRAPES) ×1 IMPLANT
DRAPE UTILITY XL STRL (DRAPES) ×2 IMPLANT
DRSG TEGADERM 2-3/8X2-3/4 SM (GAUZE/BANDAGES/DRESSINGS) ×1 IMPLANT
DRSG TEGADERM 4X4.75 (GAUZE/BANDAGES/DRESSINGS) ×1 IMPLANT
ELECT REM PT RETURN 9FT ADLT (ELECTROSURGICAL) ×2
ELECTRODE REM PT RTRN 9FT ADLT (ELECTROSURGICAL) ×1 IMPLANT
GAUZE SPONGE 4X4 12PLY STRL LF (GAUZE/BANDAGES/DRESSINGS) ×2 IMPLANT
GLOVE BIO SURGEON STRL SZ 6 (GLOVE) ×2 IMPLANT
GLOVE BIOGEL PI IND STRL 6.5 (GLOVE) ×1 IMPLANT
GLOVE BIOGEL PI IND STRL 7.0 (GLOVE) IMPLANT
GLOVE BIOGEL PI INDICATOR 6.5 (GLOVE) ×1
GLOVE BIOGEL PI INDICATOR 7.0 (GLOVE) ×1
GLOVE ECLIPSE 6.5 STRL STRAW (GLOVE) ×1 IMPLANT
GOWN PREVENTION PLUS XLARGE (GOWN DISPOSABLE) ×2 IMPLANT
GOWN PREVENTION PLUS XXLARGE (GOWN DISPOSABLE) ×2 IMPLANT
NDL HYPO 25X1 1.5 SAFETY (NEEDLE) ×1 IMPLANT
NEEDLE HYPO 25X1 1.5 SAFETY (NEEDLE) ×2 IMPLANT
NS IRRIG 1000ML POUR BTL (IV SOLUTION) IMPLANT
PACK BASIN DAY SURGERY FS (CUSTOM PROCEDURE TRAY) ×2 IMPLANT
PACK UNIVERSAL I (CUSTOM PROCEDURE TRAY) IMPLANT
PENCIL BUTTON HOLSTER BLD 10FT (ELECTRODE) ×2 IMPLANT
SLEEVE SCD COMPRESS KNEE MED (MISCELLANEOUS) IMPLANT
SPONGE LAP 4X18 X RAY DECT (DISPOSABLE) ×2 IMPLANT
STAPLER VISISTAT 35W (STAPLE) IMPLANT
STRIP CLOSURE SKIN 1/2X4 (GAUZE/BANDAGES/DRESSINGS) ×1 IMPLANT
SUT ETHILON 2 0 FS 18 (SUTURE) ×1 IMPLANT
SUT MNCRL AB 4-0 PS2 18 (SUTURE) ×2 IMPLANT
SUT SILK 2 0 SH (SUTURE) ×1 IMPLANT
SUT SILK 3 0 TIES 17X18 (SUTURE)
SUT SILK 3-0 18XBRD TIE BLK (SUTURE) IMPLANT
SUT VIC AB 2-0 SH 27 (SUTURE) ×2
SUT VIC AB 2-0 SH 27XBRD (SUTURE) IMPLANT
SUT VIC AB 3-0 SH 27 (SUTURE) ×2
SUT VIC AB 3-0 SH 27X BRD (SUTURE) ×1 IMPLANT
SUT VICRYL 4-0 PS2 18IN ABS (SUTURE) ×2 IMPLANT
SYR CONTROL 10ML LL (SYRINGE) ×2 IMPLANT
TOWEL OR 17X24 6PK STRL BLUE (TOWEL DISPOSABLE) ×2 IMPLANT
TOWEL OR NON WOVEN STRL DISP B (DISPOSABLE) ×1 IMPLANT
TUBE CONNECTING 20X1/4 (TUBING) IMPLANT
YANKAUER SUCT BULB TIP NO VENT (SUCTIONS) IMPLANT

## 2012-12-08 NOTE — H&P (View-Only) (Signed)
Chief Complaint:  Right back melanoma.   HISTORY: Pt is an 79 yo M who is around 1 year s/p WLE and SLN bx left back melanoma.  Dr. Margo Aye found a new melanoma on his right back at his last checkup.  He presents for excision.  This is far enough away that it does not appear to be an in transit metastasis or a satellite lesion.  He denies pain.  He had a shave biopsy that demonstrated 0.77 mm depth with 1 mitosis per high powered field.    Past Medical History  Diagnosis Date  . Hypercholesteremia     takes lovastatin daily  . Spinal stenosis   . Hypertension     takes Lisinopril daily  . Spinal stenosis   . Back pain   . Carotid stenosis     right  . GERD (gastroesophageal reflux disease)     takes Omeprazole daily  . Early cataracts, bilateral   . Macular degeneration     dry  . Impaired hearing   . Cancer 10/01/11    Back - Melanoma  . Dysuria   . Keratosis   . Complication of anesthesia     has severe memory loss a week post op 8/13    Past Surgical History  Procedure Laterality Date  . Appendectomy      as a child  . Cholecystectomy  approx. 1992  . Endarterectomy  03/19/2011    Procedure: ENDARTERECTOMY CAROTID;  Surgeon: Chuck Hint, MD;  Location: [facility] OR;  Service: Vascular;  Laterality: Right;  Right Carotid Endarterectomy with Primary Closure  . Carotid endarterectomy  03/19/11    RIGHT  cea  . Melanoma excision with sentinel lymph node biopsy  8/13    melanoma removed back with skin graft-snbx lt axillary    Current Outpatient Prescriptions  Medication Sig Dispense Refill  . aspirin 325 MG tablet Take 325 mg by mouth daily.      . Calcium Carbonate-Vitamin D (CALCIUM 600 + D PO) Take 1 tablet by mouth daily.       . diazepam (VALIUM) 5 MG tablet Take 5 mg by mouth every 6 (six) hours as needed.      Marland Kitchen lisinopril (PRINIVIL,ZESTRIL) 10 MG tablet Take 10 mg by mouth daily.       Marland Kitchen lovastatin (ALTOPREV) 20 MG 24 hr tablet Take 20 mg by mouth at bedtime.        Marland Kitchen omeprazole (PRILOSEC) 20 MG capsule Take 20 mg by mouth daily.        . simvastatin (ZOCOR) 20 MG tablet Take 20 mg by mouth daily.       . vitamin E 400 UNIT capsule Take 400 Units by mouth daily.         No current facility-administered medications for this visit.     Allergies  Allergen Reactions  . Codeine      Family History  Problem Relation Age of Onset  . Anesthesia problems Neg Hx   . Hypotension Neg Hx   . Malignant hyperthermia Neg Hx   . Pseudochol deficiency Neg Hx   . Ulcers Mother      History   Social History  . Marital Status: Single    Spouse Name: N/A    Number of Children: N/A  . Years of Education: N/A   Social History Main Topics  . Smoking status: Former Smoker    Types: Cigarettes, Pipe    Quit date: 12/03/1962  . Smokeless tobacco:  Former Neurosurgeon     Comment: quit 40+yrs ago  . Alcohol Use: 0.6 oz/week    1 Glasses of wine per week     Comment: 1 glass of wine every night with evening mean  . Drug Use: No  . Sexual Activity: No    REVIEW OF SYSTEMS - PERTINENT POSITIVES ONLY: 12 point review of systems negative other than HPI and PMH except for confusion/dementia  EXAM: Filed Vitals:   12/06/12 1448  BP: 130/70  Pulse: 66   Filed Weights   12/06/12 1448  Weight: 151 lb (68.493 kg)     Gen:  No acute distress.  Very thin.   Neurological: Confused about situation.  Repetitive questioning.   Hard of hearing.   Head: Normocephalic and atraumatic.  Eyes: Conjunctivae are normal. Pupils are equal, round, and reactive to light. No scleral icterus.  Neck: Normal range of motion. Neck supple. No tracheal deviation or thyromegaly present.  Cardiovascular: Normal rate, regular rhythm, normal heart sounds and intact distal pulses.  Exam reveals no gallop and no friction rub.  No murmur heard. Respiratory: Effort normal.  No respiratory distress. No chest wall tenderness. Breath sounds normal.  No wheezes, rales or rhonchi.  GI: Soft.  Bowel sounds are normal. The abdomen is soft and nontender.  There is no rebound and no guarding.  Musculoskeletal: Kyphotic.  Slow to move.    Lymphadenopathy: No cervical, preauricular, postauricular or axillary adenopathy is present Skin: Skin is warm and dry. No rash noted. No diaphoresis. No erythema. No pallor. No clubbing, cyanosis, or edema.  No lesions on left scar.  Right scar without residual pigment.  Psychiatric: Normal mood and affect. Behavior is normal. Judgment and thought content normal.    LABORATORY RESULTS: Available labs are reviewed  Path  Lentigo maligna melanoma.  0.77 mm breslow thickness.  No ulceration, deep margin free, lateral margins with atypical melanocytic hyperplasia, no LVI.  + TIL, no regression, 1 mitosis/per HPF.    RADIOLOGY RESULTS: See E-Chart or I-Site for most recent results.  Images and reports are reviewed.  n/a  ASSESSMENT AND PLAN: Melanoma of back Pt has new back melanoma on opposite side.   This would classically require a SLN biopsy due to presence of mitoses.  The patient was offered SLN biopsy.    However, the patient is 65, and would not be candidate for adjuvant interferon even if positive.  The patient had significant issues with anesthesia last time.  We will avoid general anesthesia this time and just do wide local excision with 1 cm margins.    The daughter is in agreement with this plan.        Maudry Diego MD Surgical Oncology, General and Endocrine Surgery [facility]      Visit Diagnoses: 1. Melanoma of back     Primary Care Physician: Lenora Boys, MD

## 2012-12-08 NOTE — Telephone Encounter (Signed)
Left message about rationale for not doing SLN bx.

## 2012-12-08 NOTE — Interval H&P Note (Signed)
History and Physical Interval Note:  12/08/2012 10:43 AM  Buel Ream.  has presented today for surgery, with the diagnosis of right back melanoma   The various methods of treatment have been discussed with the patient and family. After consideration of risks, benefits and other options for treatment, the patient has consented to  Procedure(s): wide local excision right back MELANOMA EXCISION (Right) as a surgical intervention .  The patient's history has been reviewed, patient examined, no change in status, stable for surgery.  I have reviewed the patient's chart and labs.  Questions were answered to the patient's satisfaction.     Deklynn Charlet

## 2012-12-08 NOTE — Op Note (Signed)
PRE-OPERATIVE DIAGNOSIS: cT1bN0 left back melanoma  POST-OPERATIVE DIAGNOSIS:  Same  PROCEDURE:  Procedure(s): Wide local excision 1 cm margins, advancement flap closure for defect 4 cm x 6 cm  SURGEON:  Surgeon(s): Almond Lint, MD  ANESTHESIA:   local and MAC  DRAINS: none   LOCAL MEDICATIONS USED:  MARCAINE with epineprhine   and XYLOCAINE   SPECIMEN:  Source of Specimen: WLE right back melanoma, additional lateral margin, additional medial margin.   \ FINDINGS:  No gross residual disease.    DISPOSITION OF SPECIMEN:  PATHOLOGY  COUNTS:  YES  PLAN OF CARE: Discharge to home after PACU  PATIENT DISPOSITION:  PACU - hemodynamically stable.    PROCEDURE:   Pt was identified in the holding area, taken to the OR, and placed laterally on the OR table.  MAC was induced.  Time out was performed according to the surgical safety checklist.  When all was correct, we continued.    The patient's right upper back was prepped and draped in sterile fashion.   The melanoma was identified and 1 cm margins were marked out.  20 mL local was administered under the melanoma and the adjacent tissue.  A #15 blade was used to incise the skin around the melanoma.  The cautery was used to take the dissection down to the fascia.  The skin was marked in situ with silk suture.  The cautery was used to take the specimen off the fascia, and it was passed off the table.    Penetrating towel clips were used to elevate the edges of the incision and the skin was freed up in all directions.  This was pulled together in an oblique orientation.  Additional local was administered. The redundant tissue was taken off both corners to make an ellipse with sharp tissue scissors.  These were marked as well.  The skin was pulled together and held with penetrating towel clips. Deep interrupted 2-0 vicryl sutures were placed to relieve tension.  The skin was then reapproximated with 3-0 interrupted vicryl deep dermal sutures  and 4-0 monocryl running subcuticular sutures.  Three 2-0 nylon horizontal mattress sutures were placed as well.  The wound was dressed with Benzoin, steristrips, gauze, and tegaderm.    Needle, sponge, and instrument counts were correct.  The patient was awakened from anesthesia and taken to the PACU in stable condition.

## 2012-12-08 NOTE — Anesthesia Procedure Notes (Signed)
Procedure Name: MAC Date/Time: 12/08/2012 10:57 AM Performed by: Zenia Resides D Pre-anesthesia Checklist: Patient identified, Timeout performed, Emergency Drugs available, Suction available and Patient being monitored Patient Re-evaluated:Patient Re-evaluated prior to inductionOxygen Delivery Method: Simple face mask Placement Confirmation: CO2 detector

## 2012-12-08 NOTE — Anesthesia Postprocedure Evaluation (Signed)
  Anesthesia Post-op Note  Patient: Thomas Mccarty.  Procedure(s) Performed: Procedure(s): wide local excision right back MELANOMA EXCISION (Right)  Patient Location: PACU  Anesthesia Type:MAC  Level of Consciousness: awake and alert   Airway and Oxygen Therapy: Patient Spontanous Breathing  Post-op Pain: none  Post-op Assessment: Post-op Vital signs reviewed  Post-op Vital Signs: stable  Complications: No apparent anesthesia complications

## 2012-12-08 NOTE — Transfer of Care (Signed)
Immediate Anesthesia Transfer of Care Note  Patient: Thomas Mccarty.  Procedure(s) Performed: Procedure(s): wide local excision right back MELANOMA EXCISION (Right)  Patient Location: PACU  Anesthesia Type:MAC  Level of Consciousness: awake, alert  and oriented  Airway & Oxygen Therapy: Patient Spontanous Breathing and Patient connected to nasal cannula oxygen  Post-op Assessment: Report given to PACU RN and Post -op Vital signs reviewed and stable  Post vital signs: Reviewed and stable  Complications: No apparent anesthesia complications

## 2012-12-08 NOTE — Anesthesia Preprocedure Evaluation (Signed)
Anesthesia Evaluation  Patient identified by MRN, date of birth, ID band Patient awake    Reviewed: Allergy & Precautions, H&P , NPO status , Patient's Chart, lab work & pertinent test results  History of Anesthesia Complications (+) history of anesthetic complications  Airway Mallampati: I  Neck ROM: Full    Dental  (+) Teeth Intact   Pulmonary neg pulmonary ROS,  breath sounds clear to auscultation        Cardiovascular hypertension, + Peripheral Vascular Disease Rhythm:Regular Rate:Normal     Neuro/Psych negative neurological ROS     GI/Hepatic GERD-  ,  Endo/Other  negative endocrine ROS  Renal/GU      Musculoskeletal negative musculoskeletal ROS (+)   Abdominal   Peds  Hematology   Anesthesia Other Findings   Reproductive/Obstetrics                           Anesthesia Physical Anesthesia Plan  ASA: III  Anesthesia Plan: MAC   Post-op Pain Management:    Induction: Intravenous  Airway Management Planned: Simple Face Mask  Additional Equipment:   Intra-op Plan:   Post-operative Plan: Extubation in OR  Informed Consent: I have reviewed the patients History and Physical, chart, labs and discussed the procedure including the risks, benefits and alternatives for the proposed anesthesia with the patient or authorized representative who has indicated his/her understanding and acceptance.     Plan Discussed with: CRNA and Surgeon  Anesthesia Plan Comments:         Anesthesia Quick Evaluation

## 2012-12-09 NOTE — Progress Notes (Signed)
Quick Note:  Please let patient's daughter know that the margins are all negative ______

## 2012-12-09 NOTE — Addendum Note (Signed)
Addendum created 12/09/12 1131 by Jewel Baize Haruki Arnold, CRNA   Modules edited: Anesthesia Responsible Staff

## 2012-12-10 ENCOUNTER — Encounter (HOSPITAL_BASED_OUTPATIENT_CLINIC_OR_DEPARTMENT_OTHER): Payer: Self-pay | Admitting: General Surgery

## 2012-12-10 ENCOUNTER — Telehealth (INDEPENDENT_AMBULATORY_CARE_PROVIDER_SITE_OTHER): Payer: Self-pay

## 2012-12-10 NOTE — Telephone Encounter (Signed)
Pts daughter returned call and advised of attached path result.

## 2012-12-10 NOTE — Telephone Encounter (Signed)
LM on daughter's VM.  All margins are negative.  No evidence of malignancy.

## 2012-12-22 ENCOUNTER — Encounter (INDEPENDENT_AMBULATORY_CARE_PROVIDER_SITE_OTHER): Payer: Self-pay | Admitting: General Surgery

## 2012-12-22 ENCOUNTER — Ambulatory Visit (INDEPENDENT_AMBULATORY_CARE_PROVIDER_SITE_OTHER): Payer: Medicare Other | Admitting: General Surgery

## 2012-12-22 VITALS — BP 150/80 | HR 60 | Resp 16 | Ht 73.0 in | Wt 156.8 lb

## 2012-12-22 DIAGNOSIS — C4359 Malignant melanoma of other part of trunk: Secondary | ICD-10-CM

## 2012-12-22 MED ORDER — LEVOFLOXACIN 750 MG PO TABS: 750.0000 mg | ORAL_TABLET | Freq: Every day | ORAL | Status: DC

## 2012-12-22 NOTE — Patient Instructions (Signed)
Do warm soaks to back wound.  (usually a hot washcloth works best).  Wash wound daily in shower.  You do not need to scrub it.  Apply antibiotic ointment (A&D or polysporin or neosporin) daily with non stick gauze.     Take antibiotic daily.

## 2012-12-24 ENCOUNTER — Encounter (INDEPENDENT_AMBULATORY_CARE_PROVIDER_SITE_OTHER): Payer: Self-pay

## 2012-12-29 NOTE — Assessment & Plan Note (Signed)
Infected skin incision.    Sutures removed.  Antibiotic ointment and telfa.    Follow up in 1-2 weeks.

## 2012-12-29 NOTE — Progress Notes (Signed)
HISTORY: Pt is s/p wide local excision 12/08/2012 of back melanoma.  He reports a lot more pain over the last few days.  He denies fevers/ chills.      EXAM: General:  Alert and oriented.   Incision:  Erythematous, swollen, weeping wound.     PATHOLOGY: Diagnosis 1. Skin , Right upper back - SKIN WITH ULCERATION AND DENSE DERMAL INFLAMMATION. - SOLAR ELASTOSIS. - THERE IS NO EVIDENCE OF MALIGNANCY. 2. Skin , Right back melanoma lateral - BENIGN SKIN. 3. Skin , Right back medial margin - BENIGN SKIN.   ASSESSMENT AND PLAN:   Melanoma of back Infected skin incision.    Sutures removed.  Antibiotic ointment and telfa.    Follow up in 1-2 weeks.        Maudry Diego, MD Surgical Oncology, General & Endocrine Surgery [facility]  Lenora Boys, MD Lenora Boys, MD

## 2013-01-04 ENCOUNTER — Ambulatory Visit (INDEPENDENT_AMBULATORY_CARE_PROVIDER_SITE_OTHER): Payer: Medicare Other | Admitting: General Surgery

## 2013-01-04 ENCOUNTER — Encounter (INDEPENDENT_AMBULATORY_CARE_PROVIDER_SITE_OTHER): Payer: Self-pay | Admitting: General Surgery

## 2013-01-04 VITALS — BP 108/68 | HR 76 | Temp 97.2°F | Resp 14 | Ht 73.0 in | Wt 154.8 lb

## 2013-01-04 DIAGNOSIS — C4359 Malignant melanoma of other part of trunk: Secondary | ICD-10-CM

## 2013-01-04 MED ORDER — LEVOFLOXACIN 750 MG PO TABS: 750.0000 mg | ORAL_TABLET | Freq: Every day | ORAL | Status: DC

## 2013-01-04 NOTE — Patient Instructions (Signed)
Follow up in 3 ish weeks.  Continue antibiotic ointment until it scabs over.    At that point, you can stop dressing it.

## 2013-01-05 NOTE — Progress Notes (Signed)
HISTORY: Pt is s/p wide local excision 12/08/2012 of back melanoma.  Pain has improved since i took out the sutures.  He does still complain of soreness though.    EXAM: General:  Alert and oriented.   Incision:  Dramatic improvement.  Still some mild erythema.  Minimal drainage.  Central portion around 2 cm circular defect with some granulation tissue.     PATHOLOGY: Diagnosis 1. Skin , Right upper back - SKIN WITH ULCERATION AND DENSE DERMAL INFLAMMATION. - SOLAR ELASTOSIS. - THERE IS NO EVIDENCE OF MALIGNANCY. 2. Skin , Right back melanoma lateral - BENIGN SKIN. 3. Skin , Right back medial margin - BENIGN SKIN.   ASSESSMENT AND PLAN:   Melanoma of back Wound infection healing.    Do one additional week of antibiotics.  Follow up in 3-4 weeks.         Maudry Diego, MD Surgical Oncology, General & Endocrine Surgery [facility]  Lenora Boys, MD Lenora Boys, MD

## 2013-01-05 NOTE — Assessment & Plan Note (Addendum)
Wound infection healing.    Do one additional week of antibiotics.  Follow up in 3-4 weeks.

## 2013-01-14 ENCOUNTER — Telehealth (INDEPENDENT_AMBULATORY_CARE_PROVIDER_SITE_OTHER): Payer: Self-pay | Admitting: *Deleted

## 2013-01-14 NOTE — Telephone Encounter (Signed)
Patient called asking if he should continue the currently daily dressing changes until he comes in next Friday.  Spoke to Ward who states yes patient needs to continue current regimen until next Friday.  Patient states understanding at this time and agreeable.

## 2013-01-22 ENCOUNTER — Ambulatory Visit (INDEPENDENT_AMBULATORY_CARE_PROVIDER_SITE_OTHER): Payer: Medicare Other

## 2013-01-22 ENCOUNTER — Encounter (INDEPENDENT_AMBULATORY_CARE_PROVIDER_SITE_OTHER): Payer: Medicare Other | Admitting: General Surgery

## 2013-01-22 DIAGNOSIS — Z5189 Encounter for other specified aftercare: Secondary | ICD-10-CM

## 2013-01-22 NOTE — Progress Notes (Signed)
Patient comes in for wound check. He has a melanoma excision back on 12/08/12. His wound has a small scab but has closed completely. Advised nothing else needs to be done to the area. They will follow up with dermatology in the next few months. No follow up needed with Dr Donell Beers at this time. They will call if any other symptoms should arise.

## 2013-02-19 DIAGNOSIS — D235 Other benign neoplasm of skin of trunk: Secondary | ICD-10-CM | POA: Diagnosis not present

## 2013-02-19 DIAGNOSIS — L821 Other seborrheic keratosis: Secondary | ICD-10-CM | POA: Diagnosis not present

## 2013-02-19 DIAGNOSIS — Z85820 Personal history of malignant melanoma of skin: Secondary | ICD-10-CM | POA: Diagnosis not present

## 2013-02-19 DIAGNOSIS — L570 Actinic keratosis: Secondary | ICD-10-CM | POA: Diagnosis not present

## 2013-03-03 DIAGNOSIS — Z85820 Personal history of malignant melanoma of skin: Secondary | ICD-10-CM | POA: Diagnosis not present

## 2013-03-03 DIAGNOSIS — L570 Actinic keratosis: Secondary | ICD-10-CM | POA: Diagnosis not present

## 2013-04-07 IMAGING — CT CT HEAD W/O CM
2 series · 16 of 30 positions shown, 19 images · non-contrast
Comparison: None.

CLINICAL DATA: 85-year-old male with dizziness, nausea.

CT HEAD WITHOUT CONTRAST
TECHNIQUE: Contiguous axial images were obtained from the base of
the skull through the vertex without contrast.

[Series 2: head w/o · axial · non-contrast · 0.44mm/px · z∈[-126,-6]mm · 9 of 31 slices shown, 12 images]
[im 4/31  brain]
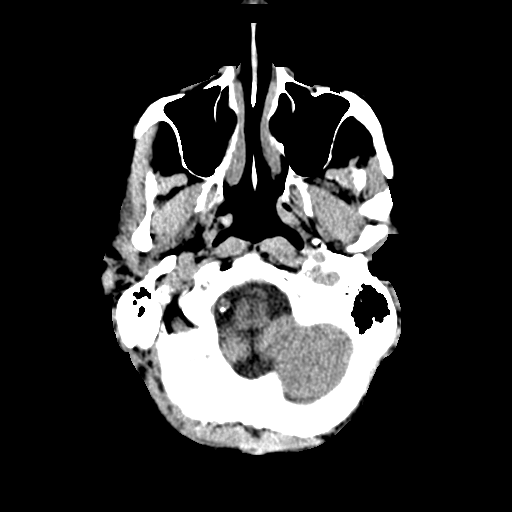
[im 4/31  bone]
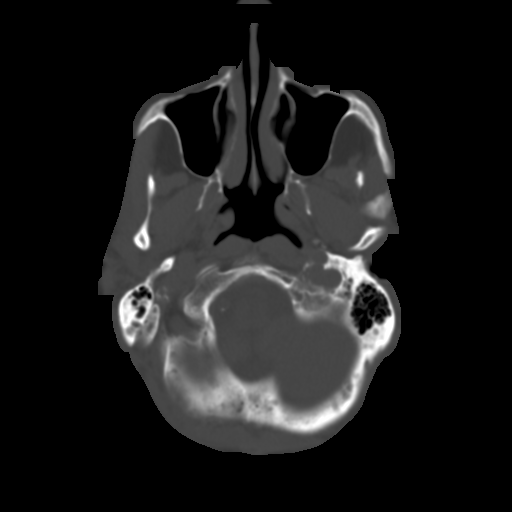
[im 7/31  brain]
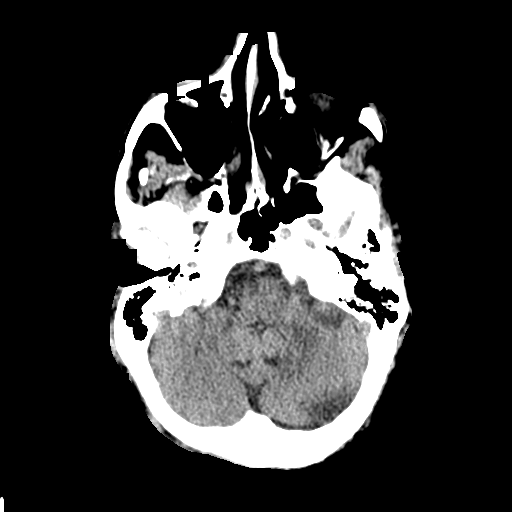
[im 10/31  brain]
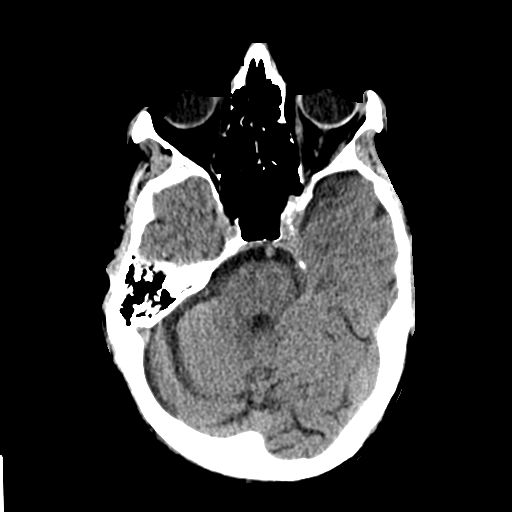
[im 13/31  brain]
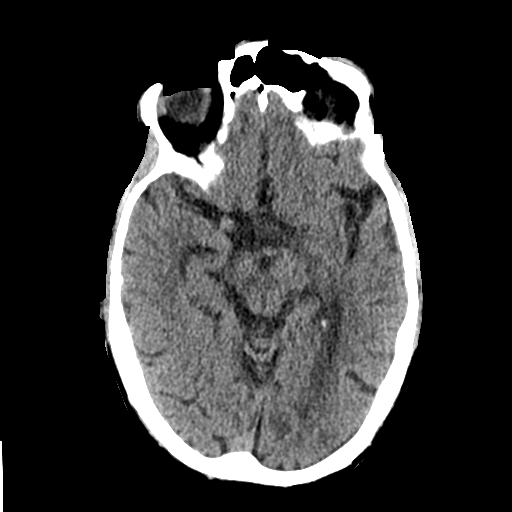
[im 16/31  brain]
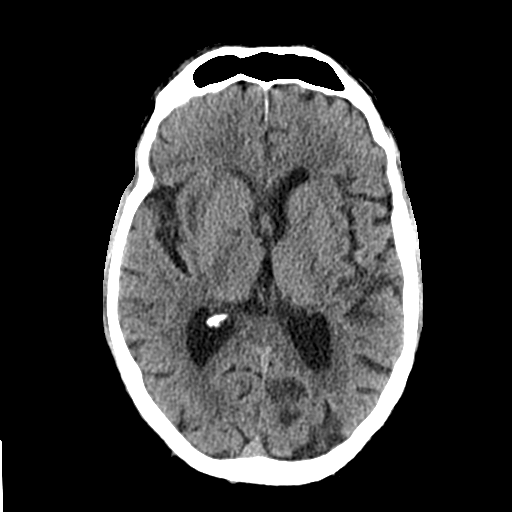
[im 16/31  bone]
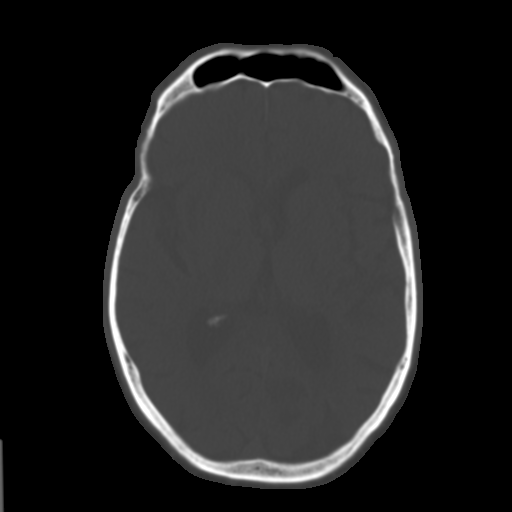
[im 19/31  brain]
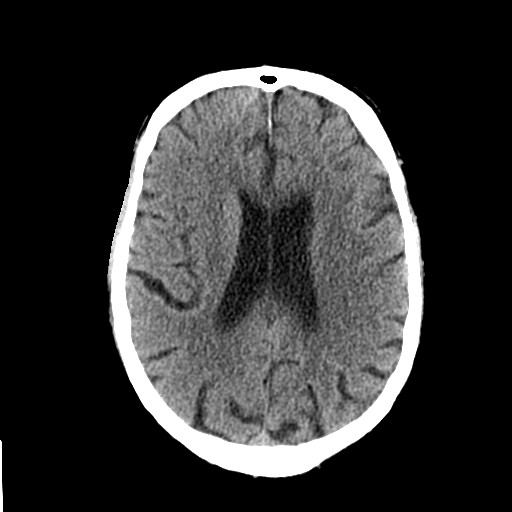
[im 22/31  brain]
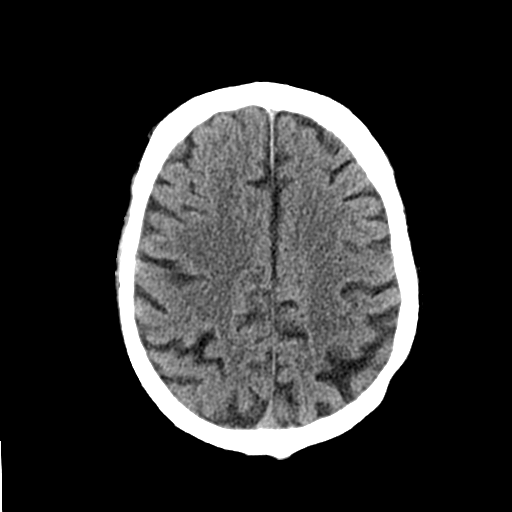
[im 25/31  brain]
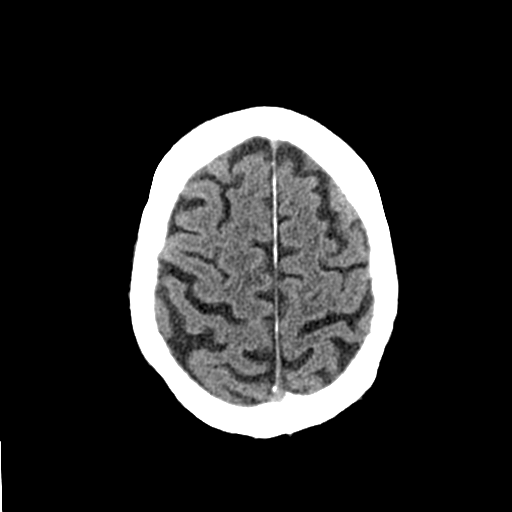
[im 28/31  brain]
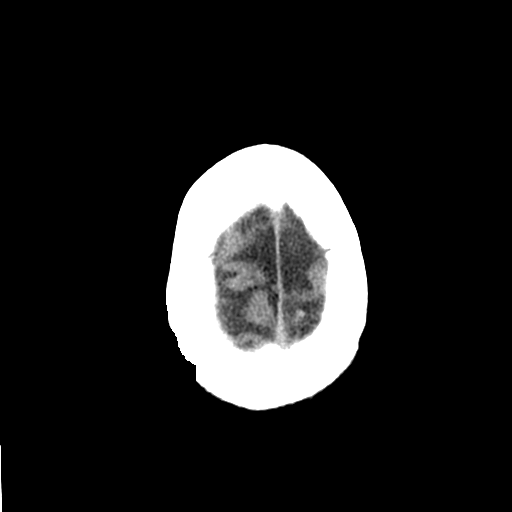
[im 28/31  bone]
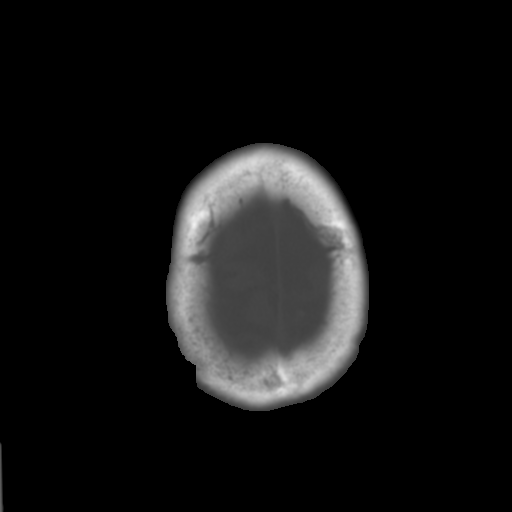

[Series 3: bone windows · axial · 0.44mm/px · z∈[-126,-27]mm · 7 of 51 slices shown]
[im 6/51  bone]
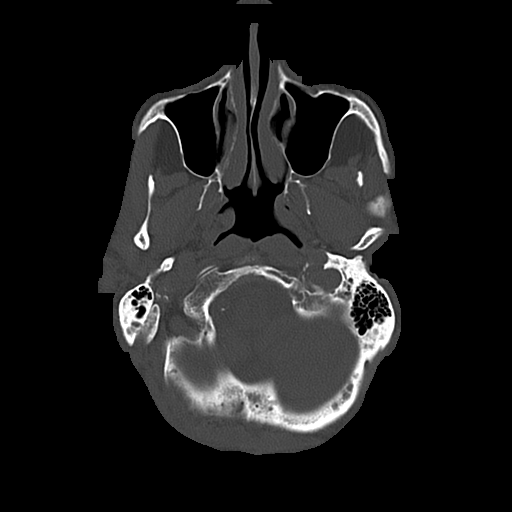
[im 12/51  bone]
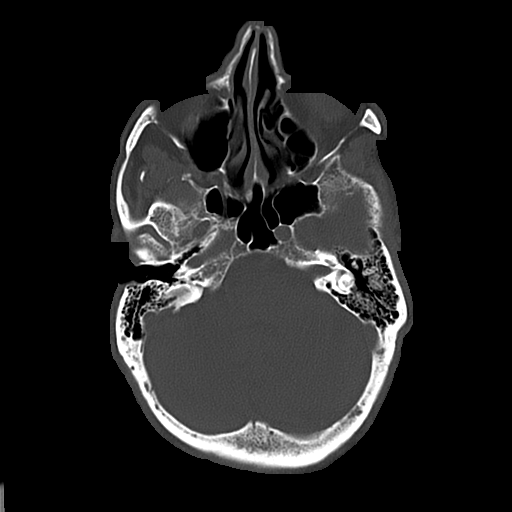
[im 17/51  bone]
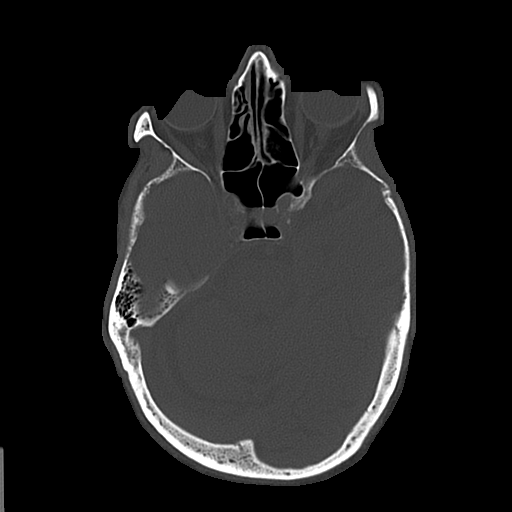
[im 23/51  bone]
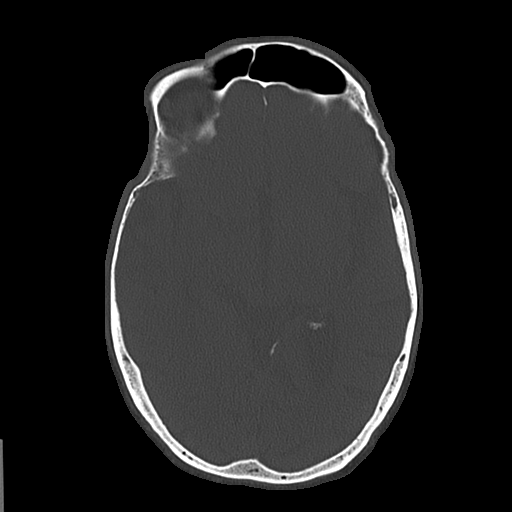
[im 28/51  bone]
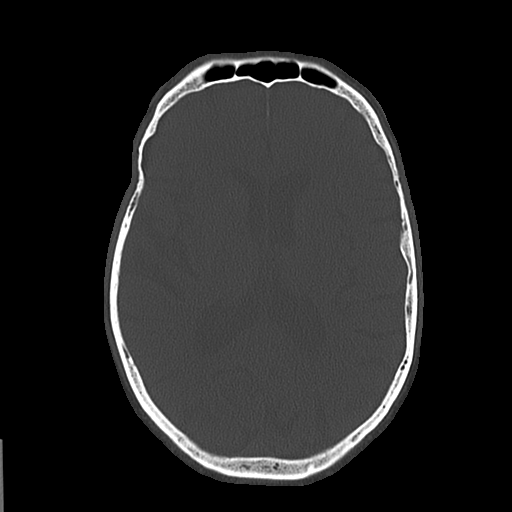
[im 34/51  bone]
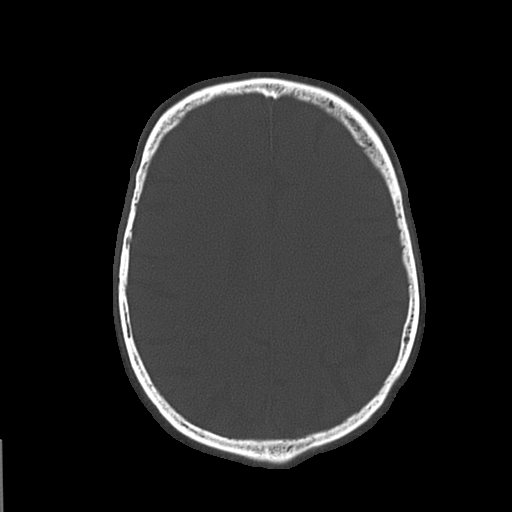
[im 39/51  bone]
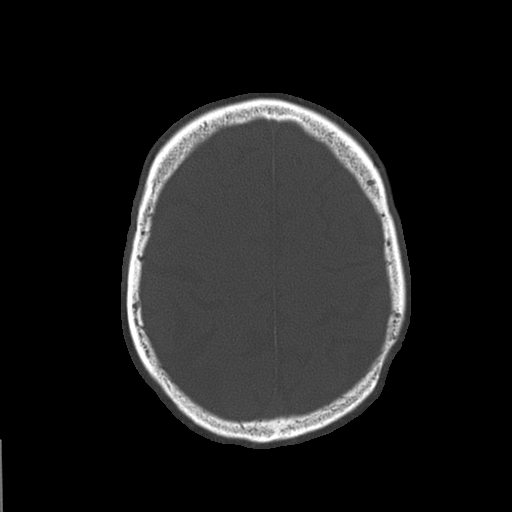

[16 of 30 positions shown; findings below may reference images not displayed]

FINDINGS: Visualized paranasal sinuses and mastoids are clear.
Visualized orbit soft tissues are within normal limits.  Visualized
scalp soft tissues are within normal limits.  Are pass bones

Mild hypodensity in the right basal ganglia/deep white matter
capsules.  Otherwise normal gray-white matter differentiation
throughout the brain.  No ventriculomegaly.  Dural calcifications.
No acute intracranial hemorrhage identified.  No evidence of
cortically based acute infarction identified.  Calcified
atherosclerosis at the skull base.  No suspicious intracranial
vascular hyperdensity. No midline shift, mass effect, or evidence
of mass lesion.
IMPRESSION: Largely unremarkable for age noncontrast CT appearance of the
brain.  No acute intracranial abnormality.

## 2013-04-07 IMAGING — CR DG CHEST 2V
3 series · 3 of 3 positions shown · non-contrast
Comparison: None.

CLINICAL DATA: Dizziness, nausea and vomiting

CHEST - 2 VIEW

[w chest lat]
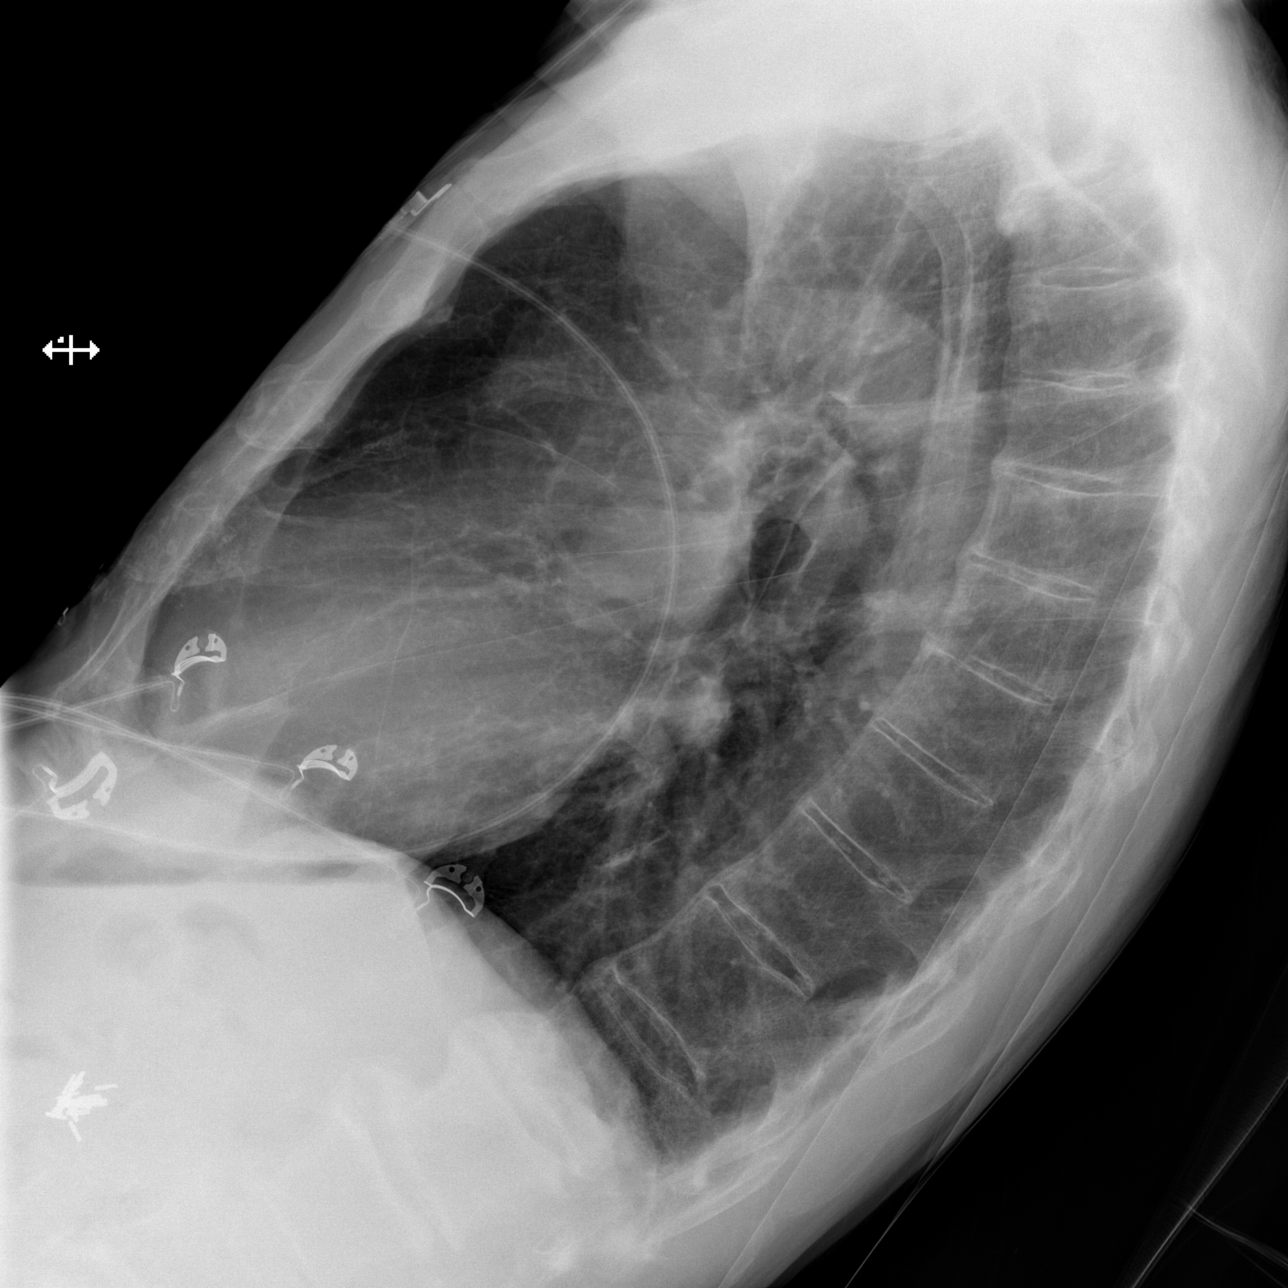

[x chest ap (1 of 2)]
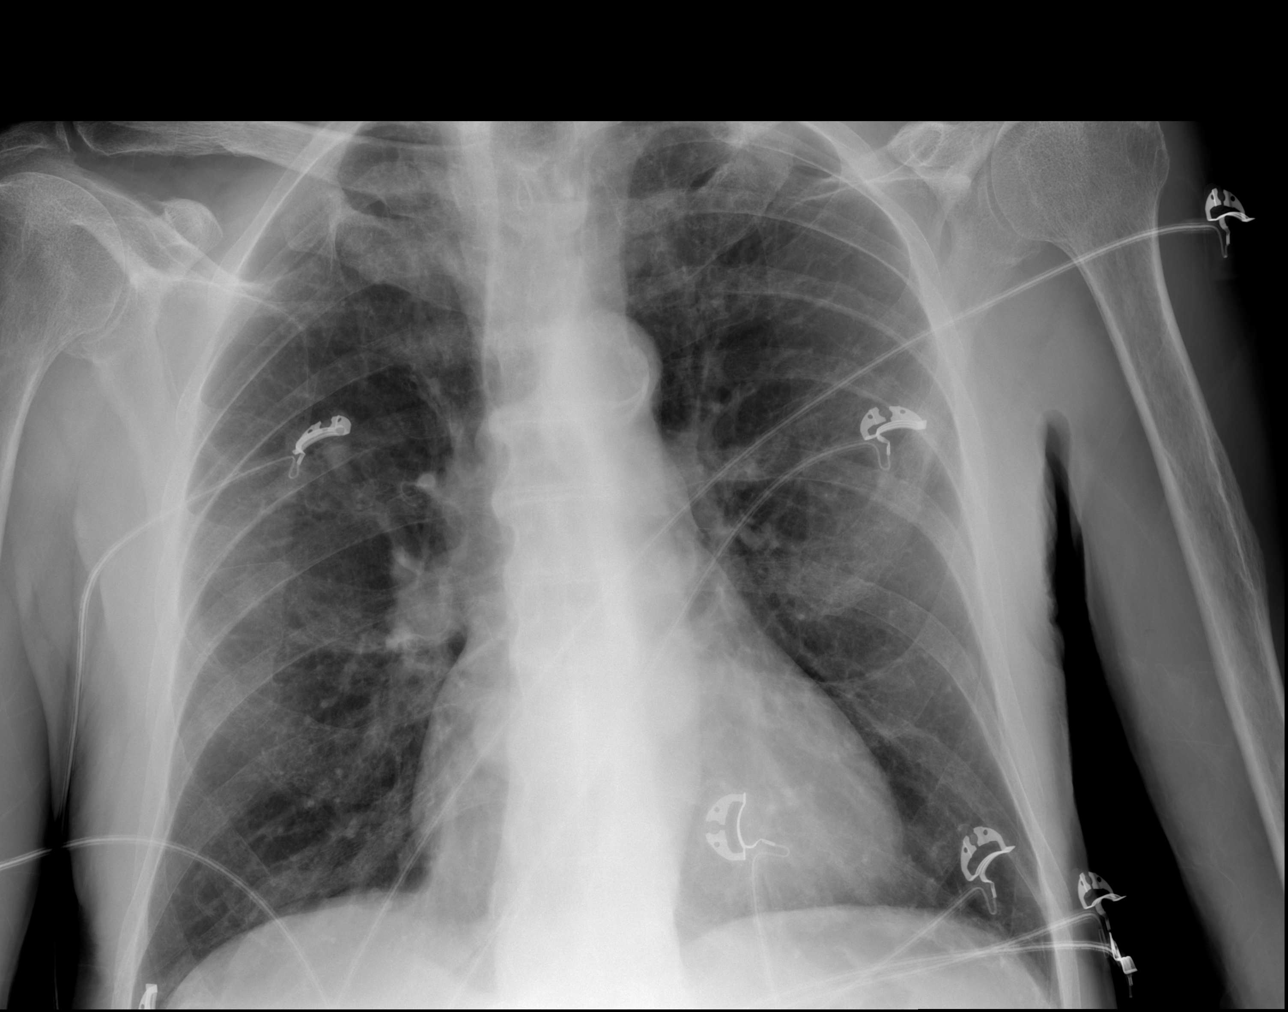

[x chest ap (2 of 2)]
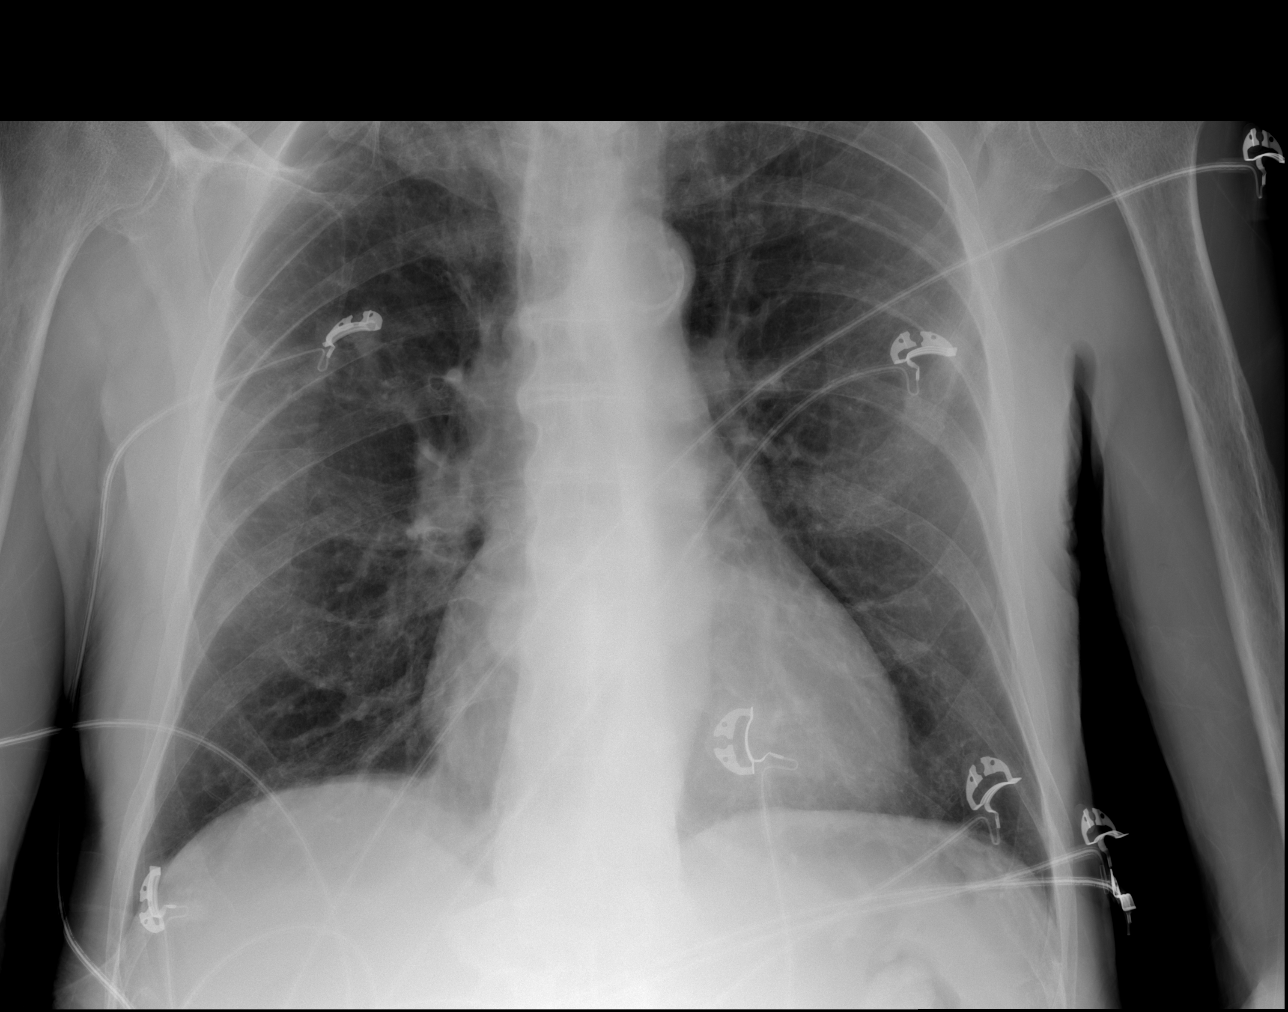

[3 of 3 positions shown; findings below may reference images not displayed]

FINDINGS: The lungs are clear but hyperaerated consistent with
COPD.  Mediastinal contours appear normal.  The heart is mildly
enlarged.  There are degenerative changes throughout the thoracic
spine and the bones are somewhat osteopenic.
IMPRESSION: COPD.  No active lung disease.  Mild cardiomegaly.

## 2013-05-05 DIAGNOSIS — L570 Actinic keratosis: Secondary | ICD-10-CM | POA: Diagnosis not present

## 2013-05-05 DIAGNOSIS — D235 Other benign neoplasm of skin of trunk: Secondary | ICD-10-CM | POA: Diagnosis not present

## 2013-05-05 DIAGNOSIS — Z85820 Personal history of malignant melanoma of skin: Secondary | ICD-10-CM | POA: Diagnosis not present

## 2013-05-18 DIAGNOSIS — G4700 Insomnia, unspecified: Secondary | ICD-10-CM | POA: Diagnosis not present

## 2013-05-18 DIAGNOSIS — I10 Essential (primary) hypertension: Secondary | ICD-10-CM | POA: Diagnosis not present

## 2013-05-18 DIAGNOSIS — R1013 Epigastric pain: Secondary | ICD-10-CM | POA: Diagnosis not present

## 2013-05-18 DIAGNOSIS — K3189 Other diseases of stomach and duodenum: Secondary | ICD-10-CM | POA: Diagnosis not present

## 2013-06-16 DIAGNOSIS — Z85820 Personal history of malignant melanoma of skin: Secondary | ICD-10-CM | POA: Diagnosis not present

## 2013-06-16 DIAGNOSIS — L570 Actinic keratosis: Secondary | ICD-10-CM | POA: Diagnosis not present

## 2013-09-16 DIAGNOSIS — L821 Other seborrheic keratosis: Secondary | ICD-10-CM | POA: Diagnosis not present

## 2013-09-16 DIAGNOSIS — L570 Actinic keratosis: Secondary | ICD-10-CM | POA: Diagnosis not present

## 2013-09-16 DIAGNOSIS — Z85820 Personal history of malignant melanoma of skin: Secondary | ICD-10-CM | POA: Diagnosis not present

## 2013-09-16 DIAGNOSIS — D235 Other benign neoplasm of skin of trunk: Secondary | ICD-10-CM | POA: Diagnosis not present

## 2013-12-03 DIAGNOSIS — Z85820 Personal history of malignant melanoma of skin: Secondary | ICD-10-CM | POA: Diagnosis not present

## 2013-12-03 DIAGNOSIS — D225 Melanocytic nevi of trunk: Secondary | ICD-10-CM | POA: Diagnosis not present

## 2013-12-03 DIAGNOSIS — Z08 Encounter for follow-up examination after completed treatment for malignant neoplasm: Secondary | ICD-10-CM | POA: Diagnosis not present

## 2013-12-03 DIAGNOSIS — C44212 Basal cell carcinoma of skin of right ear and external auricular canal: Secondary | ICD-10-CM | POA: Diagnosis not present

## 2013-12-03 DIAGNOSIS — C44729 Squamous cell carcinoma of skin of left lower limb, including hip: Secondary | ICD-10-CM | POA: Diagnosis not present

## 2014-01-13 DIAGNOSIS — F0390 Unspecified dementia without behavioral disturbance: Secondary | ICD-10-CM | POA: Diagnosis not present

## 2014-01-13 DIAGNOSIS — I10 Essential (primary) hypertension: Secondary | ICD-10-CM | POA: Diagnosis not present

## 2014-02-09 DIAGNOSIS — I10 Essential (primary) hypertension: Secondary | ICD-10-CM | POA: Diagnosis not present

## 2014-02-09 DIAGNOSIS — G4700 Insomnia, unspecified: Secondary | ICD-10-CM | POA: Diagnosis not present

## 2014-02-09 DIAGNOSIS — F0390 Unspecified dementia without behavioral disturbance: Secondary | ICD-10-CM | POA: Diagnosis not present

## 2014-06-29 DIAGNOSIS — I10 Essential (primary) hypertension: Secondary | ICD-10-CM | POA: Diagnosis not present

## 2014-06-29 DIAGNOSIS — G4700 Insomnia, unspecified: Secondary | ICD-10-CM | POA: Diagnosis not present

## 2014-06-29 DIAGNOSIS — F0390 Unspecified dementia without behavioral disturbance: Secondary | ICD-10-CM | POA: Diagnosis not present

## 2014-06-29 DIAGNOSIS — E785 Hyperlipidemia, unspecified: Secondary | ICD-10-CM | POA: Diagnosis not present

## 2014-10-14 DIAGNOSIS — I447 Left bundle-branch block, unspecified: Secondary | ICD-10-CM | POA: Diagnosis not present

## 2014-10-14 DIAGNOSIS — F0390 Unspecified dementia without behavioral disturbance: Secondary | ICD-10-CM | POA: Diagnosis not present

## 2014-10-14 DIAGNOSIS — R9431 Abnormal electrocardiogram [ECG] [EKG]: Secondary | ICD-10-CM | POA: Diagnosis not present

## 2014-10-14 DIAGNOSIS — R269 Unspecified abnormalities of gait and mobility: Secondary | ICD-10-CM | POA: Diagnosis not present

## 2014-10-14 DIAGNOSIS — I10 Essential (primary) hypertension: Secondary | ICD-10-CM | POA: Diagnosis not present

## 2014-10-14 DIAGNOSIS — R42 Dizziness and giddiness: Secondary | ICD-10-CM | POA: Diagnosis not present

## 2014-10-14 DIAGNOSIS — R110 Nausea: Secondary | ICD-10-CM | POA: Diagnosis not present

## 2014-10-14 DIAGNOSIS — Z87891 Personal history of nicotine dependence: Secondary | ICD-10-CM | POA: Diagnosis not present

## 2014-10-15 DIAGNOSIS — R42 Dizziness and giddiness: Secondary | ICD-10-CM | POA: Diagnosis not present

## 2014-10-15 DIAGNOSIS — R110 Nausea: Secondary | ICD-10-CM | POA: Diagnosis not present

## 2014-10-15 DIAGNOSIS — I10 Essential (primary) hypertension: Secondary | ICD-10-CM | POA: Diagnosis not present

## 2014-11-08 DIAGNOSIS — Z85820 Personal history of malignant melanoma of skin: Secondary | ICD-10-CM | POA: Diagnosis not present

## 2014-11-08 DIAGNOSIS — Z08 Encounter for follow-up examination after completed treatment for malignant neoplasm: Secondary | ICD-10-CM | POA: Diagnosis not present

## 2014-11-08 DIAGNOSIS — D045 Carcinoma in situ of skin of trunk: Secondary | ICD-10-CM | POA: Diagnosis not present

## 2014-11-08 DIAGNOSIS — D225 Melanocytic nevi of trunk: Secondary | ICD-10-CM | POA: Diagnosis not present

## 2014-11-08 DIAGNOSIS — Z1283 Encounter for screening for malignant neoplasm of skin: Secondary | ICD-10-CM | POA: Diagnosis not present

## 2014-11-08 DIAGNOSIS — C44729 Squamous cell carcinoma of skin of left lower limb, including hip: Secondary | ICD-10-CM | POA: Diagnosis not present

## 2014-11-22 DIAGNOSIS — T814XXA Infection following a procedure, initial encounter: Secondary | ICD-10-CM | POA: Diagnosis not present

## 2014-11-23 DIAGNOSIS — X32XXXD Exposure to sunlight, subsequent encounter: Secondary | ICD-10-CM | POA: Diagnosis not present

## 2014-11-23 DIAGNOSIS — L570 Actinic keratosis: Secondary | ICD-10-CM | POA: Diagnosis not present

## 2014-11-23 DIAGNOSIS — C44329 Squamous cell carcinoma of skin of other parts of face: Secondary | ICD-10-CM | POA: Diagnosis not present

## 2014-11-23 DIAGNOSIS — Z08 Encounter for follow-up examination after completed treatment for malignant neoplasm: Secondary | ICD-10-CM | POA: Diagnosis not present

## 2014-11-23 DIAGNOSIS — C44319 Basal cell carcinoma of skin of other parts of face: Secondary | ICD-10-CM | POA: Diagnosis not present

## 2014-11-23 DIAGNOSIS — Z85828 Personal history of other malignant neoplasm of skin: Secondary | ICD-10-CM | POA: Diagnosis not present

## 2014-11-23 DIAGNOSIS — C44300 Unspecified malignant neoplasm of skin of unspecified part of face: Secondary | ICD-10-CM | POA: Diagnosis not present

## 2014-12-01 DIAGNOSIS — Z08 Encounter for follow-up examination after completed treatment for malignant neoplasm: Secondary | ICD-10-CM | POA: Diagnosis not present

## 2014-12-01 DIAGNOSIS — Z85828 Personal history of other malignant neoplasm of skin: Secondary | ICD-10-CM | POA: Diagnosis not present

## 2014-12-01 DIAGNOSIS — L570 Actinic keratosis: Secondary | ICD-10-CM | POA: Diagnosis not present

## 2014-12-01 DIAGNOSIS — X32XXXD Exposure to sunlight, subsequent encounter: Secondary | ICD-10-CM | POA: Diagnosis not present

## 2015-01-02 DIAGNOSIS — G4700 Insomnia, unspecified: Secondary | ICD-10-CM | POA: Diagnosis not present

## 2015-01-02 DIAGNOSIS — K219 Gastro-esophageal reflux disease without esophagitis: Secondary | ICD-10-CM | POA: Diagnosis not present

## 2015-01-02 DIAGNOSIS — E785 Hyperlipidemia, unspecified: Secondary | ICD-10-CM | POA: Diagnosis not present

## 2015-01-02 DIAGNOSIS — I10 Essential (primary) hypertension: Secondary | ICD-10-CM | POA: Diagnosis not present

## 2015-01-04 DIAGNOSIS — R112 Nausea with vomiting, unspecified: Secondary | ICD-10-CM | POA: Diagnosis not present

## 2015-01-04 DIAGNOSIS — A084 Viral intestinal infection, unspecified: Secondary | ICD-10-CM | POA: Diagnosis not present

## 2015-01-07 ENCOUNTER — Emergency Department (HOSPITAL_COMMUNITY): Payer: Medicare Other

## 2015-01-07 ENCOUNTER — Encounter (HOSPITAL_COMMUNITY): Payer: Self-pay

## 2015-01-07 ENCOUNTER — Emergency Department (HOSPITAL_COMMUNITY)
Admission: EM | Admit: 2015-01-07 | Discharge: 2015-01-07 | Disposition: A | Discharge status: Home / Self Care | Payer: Medicare Other | Attending: Physician Assistant | Admitting: Physician Assistant

## 2015-01-07 DIAGNOSIS — R42 Dizziness and giddiness: Secondary | ICD-10-CM

## 2015-01-07 DIAGNOSIS — I10 Essential (primary) hypertension: Secondary | ICD-10-CM | POA: Insufficient documentation

## 2015-01-07 DIAGNOSIS — E7800 Pure hypercholesterolemia, unspecified: Secondary | ICD-10-CM | POA: Insufficient documentation

## 2015-01-07 DIAGNOSIS — R51 Headache: Secondary | ICD-10-CM | POA: Insufficient documentation

## 2015-01-07 DIAGNOSIS — M542 Cervicalgia: Secondary | ICD-10-CM | POA: Diagnosis not present

## 2015-01-07 DIAGNOSIS — Z79899 Other long term (current) drug therapy: Secondary | ICD-10-CM | POA: Insufficient documentation

## 2015-01-07 DIAGNOSIS — F0390 Unspecified dementia without behavioral disturbance: Secondary | ICD-10-CM | POA: Diagnosis not present

## 2015-01-07 DIAGNOSIS — H269 Unspecified cataract: Secondary | ICD-10-CM | POA: Insufficient documentation

## 2015-01-07 DIAGNOSIS — Z8739 Personal history of other diseases of the musculoskeletal system and connective tissue: Secondary | ICD-10-CM | POA: Insufficient documentation

## 2015-01-07 DIAGNOSIS — Z872 Personal history of diseases of the skin and subcutaneous tissue: Secondary | ICD-10-CM | POA: Diagnosis not present

## 2015-01-07 DIAGNOSIS — H9190 Unspecified hearing loss, unspecified ear: Secondary | ICD-10-CM | POA: Insufficient documentation

## 2015-01-07 DIAGNOSIS — Z7982 Long term (current) use of aspirin: Secondary | ICD-10-CM | POA: Diagnosis not present

## 2015-01-07 DIAGNOSIS — R404 Transient alteration of awareness: Secondary | ICD-10-CM | POA: Diagnosis not present

## 2015-01-07 DIAGNOSIS — Z85820 Personal history of malignant melanoma of skin: Secondary | ICD-10-CM | POA: Diagnosis not present

## 2015-01-07 DIAGNOSIS — G459 Transient cerebral ischemic attack, unspecified: Secondary | ICD-10-CM

## 2015-01-07 DIAGNOSIS — K219 Gastro-esophageal reflux disease without esophagitis: Secondary | ICD-10-CM | POA: Insufficient documentation

## 2015-01-07 DIAGNOSIS — R001 Bradycardia, unspecified: Secondary | ICD-10-CM | POA: Diagnosis not present

## 2015-01-07 DIAGNOSIS — Z87891 Personal history of nicotine dependence: Secondary | ICD-10-CM | POA: Diagnosis not present

## 2015-01-07 LAB — BASIC METABOLIC PANEL
ANION GAP: 7 (ref 5–15)
BUN: 21 mg/dL — ABNORMAL HIGH (ref 6–20)
CO2: 25 mmol/L (ref 22–32)
CREATININE: 0.8 mg/dL (ref 0.61–1.24)
Calcium: 8.7 mg/dL — ABNORMAL LOW (ref 8.9–10.3)
Chloride: 101 mmol/L (ref 101–111)
GFR calc Af Amer: >60 mL/min (ref >60)
Glucose, Bld: 98 mg/dL (ref 65–99)
Potassium: 3.4 mmol/L — ABNORMAL LOW (ref 3.5–5.1)
Sodium: 133 mmol/L — ABNORMAL LOW (ref 135–145)

## 2015-01-07 LAB — URINALYSIS, ROUTINE W REFLEX MICROSCOPIC
Bilirubin Urine: NEGATIVE
Glucose, UA: NEGATIVE mg/dL
HGB URINE DIPSTICK: NEGATIVE
Ketones, ur: NEGATIVE mg/dL
LEUKOCYTES UA: NEGATIVE
NITRITE: NEGATIVE
PROTEIN: NEGATIVE mg/dL
SPECIFIC GRAVITY, URINE: 1.025 (ref 1.005–1.030)
pH: 6 (ref 5.0–8.0)

## 2015-01-07 LAB — CBC
HCT: 38.2 % — ABNORMAL LOW (ref 39.0–52.0)
HEMOGLOBIN: 12.8 g/dL — AB (ref 13.0–17.0)
MCH: 29.8 pg (ref 26.0–34.0)
MCHC: 33.5 g/dL (ref 30.0–36.0)
MCV: 89 fL (ref 78.0–100.0)
PLATELETS: 176 10*3/uL (ref 150–400)
RBC: 4.29 MIL/uL (ref 4.22–5.81)
RDW: 13.4 % (ref 11.5–15.5)
WBC: 4.2 10*3/uL (ref 4.0–10.5)

## 2015-01-07 LAB — I-STAT TROPONIN, ED: Troponin i, poc: 0.01 ng/mL (ref 0.00–0.08)

## 2015-01-07 LAB — LIPASE, BLOOD: LIPASE: 24 U/L (ref 11–51)

## 2015-01-07 LAB — I-STAT CG4 LACTIC ACID, ED: Lactic Acid, Venous: 0.96 mmol/L (ref 0.5–2.0)

## 2015-01-07 MED ORDER — ONDANSETRON HCL 4 MG PO TABS: 4.0000 mg | ORAL_TABLET | Freq: Three times a day (TID) | ORAL | Status: DC | PRN

## 2015-01-07 MED ORDER — SODIUM CHLORIDE 0.9 % IV BOLUS (SEPSIS)
500.0000 mL | Freq: Once | INTRAVENOUS | Status: AC
  Administered 2015-01-07: 500 mL via INTRAVENOUS

## 2015-01-07 MED ORDER — SODIUM CHLORIDE 0.9 % IV BOLUS (SEPSIS): 1000.0000 mL | Freq: Once | INTRAVENOUS | Status: DC

## 2015-01-07 NOTE — ED Notes (Signed)
Spoke with pt caregiver who will come pick up pt.

## 2015-01-07 NOTE — ED Provider Notes (Addendum)
CSN: [id number]     Arrival date & time 01/07/15  0725 History   First MD Initiated Contact with Patient 01/07/15 [phone]     Chief Complaint  Patient presents with  . Dizziness     (Consider location/radiation/quality/duration/timing/severity/associated sxs/prior Treatment) HPI  Patient is a 81 year old male hard of hearing history of spinal stenosis, back pain, GERD presenting today with mild headache for the last 2 days. Patient states it's mildly right-sided is not taking anything to make it better. He is unable to describe the pain, how it started or characteristics of it. Patient unable to give a good history however nursing discussed his care with his daughter who lives out of town. She states that he's been having nausea and vomiting for the last 2 days. Patient says he feels mildly dizzy. However he does not feel like the room is spinning he just feels "uncomfortable". According to daughter caretaker comes and takes care of patient occasionally interspersed to come today.  Level V caveat dementia.    Past Medical History  Diagnosis Date  . Hypercholesteremia     takes lovastatin daily  . Spinal stenosis   . Hypertension     takes Lisinopril daily  . Spinal stenosis   . Back pain   . Carotid stenosis     right  . GERD (gastroesophageal reflux disease)     takes Omeprazole daily  . Early cataracts, bilateral   . Macular degeneration     dry  . Impaired hearing   . Cancer ([place]) 10/01/11    Back - Melanoma  . Dysuria   . Keratosis   . Complication of anesthesia     has severe memory loss a week post op 8/13   Past Surgical History  Procedure Laterality Date  . Appendectomy      as a child  . Cholecystectomy  approx. 1992  . Endarterectomy  03/19/2011    Procedure: ENDARTERECTOMY CAROTID;  Surgeon: Angelia Mould, MD;  Location: [facility] OR;  Service: Vascular;  Laterality: Right;  Right Carotid Endarterectomy with Primary Closure  . Carotid endarterectomy  03/19/11     RIGHT  cea  . Melanoma excision with sentinel lymph node biopsy  8/13    melanoma removed back with skin graft-snbx lt axillary  . Melanoma excision Right 12/08/2012    Procedure: wide local excision right back MELANOMA EXCISION;  Surgeon: Stark Klein, MD;  Location: [place];  Service: General;  Laterality: Right;   Family History  Problem Relation Age of Onset  . Anesthesia problems Neg Hx   . Hypotension Neg Hx   . Malignant hyperthermia Neg Hx   . Pseudochol deficiency Neg Hx   . Ulcers Mother    Social History  Substance Use Topics  . Smoking status: Former Smoker    Types: Cigarettes, Pipe    Quit date: 12/03/1962  . Smokeless tobacco: Former Systems developer     Comment: quit 40+yrs ago  . Alcohol Use: 0.6 oz/week    1 Glasses of wine per week     Comment: 1 glass of wine every night with evening mean    Review of Systems  Unable to perform ROS: Dementia  Constitutional: Negative for fever.  HENT: Positive for hearing loss.   Cardiovascular: Negative for chest pain.  Gastrointestinal: Negative for abdominal pain.  Genitourinary: Negative for dysuria.      Allergies  Codeine  Home Medications   Prior to Admission medications   Medication Sig Start Date  End Date Taking? Authorizing Provider  amLODipine (NORVASC) 2.5 MG tablet Take 2.5 mg by mouth daily.  11/05/12  Yes Historical Provider, MD  aspirin 325 MG tablet Take 325 mg by mouth daily.   Yes Historical Provider, MD  Calcium Carbonate-Vitamin D (CALCIUM 600 + D PO) Take 1 tablet by mouth daily.     Historical Provider, MD  lisinopril (PRINIVIL,ZESTRIL) 10 MG tablet Take 10 mg by mouth daily.    Yes Historical Provider, MD  LORazepam (ATIVAN) 0.5 MG tablet Take 0.5-1 tablets by mouth at bedtime as needed. 08/26/14  Yes Historical Provider, MD  meclizine (ANTIVERT) 25 MG tablet Take 1 tablet by mouth 3 (three) times daily as needed. 10/20/14   Historical Provider, MD  omeprazole (PRILOSEC) 20 MG capsule  Take 20 mg by mouth daily.     Yes Historical Provider, MD  ondansetron (ZOFRAN) 4 MG tablet Take 1 tablet (4 mg total) by mouth every 8 (eight) hours as needed for nausea or vomiting. 01/07/15   Tawny Raspberry Lyn Darshawn Boateng, MD  simvastatin (ZOCOR) 20 MG tablet Take 20 mg by mouth daily.  09/10/11  Yes Historical Provider, MD  vitamin E 400 UNIT capsule Take 400 Units by mouth daily.      Historical Provider, MD   BP 173/70 mmHg  Pulse 52  Temp(Src) 98 F (36.7 C) (Oral)  Resp 10  Ht 6' (1.829 m)  Wt 155 lb (70.308 kg)  BMI 21.02 kg/m2  SpO2 97% Physical Exam  Constitutional: He appears well-nourished.  Unkempt 81 year old male, cachectic.  HENT:  Head: Normocephalic.  Mouth/Throat: Oropharynx is clear and moist.  Eyes: Conjunctivae are normal.  Neck: No tracheal deviation present.  Cardiovascular:  Bradycardic. Normal sinus rhythm.  Pulmonary/Chest: Effort normal. No stridor. No respiratory distress.  Abdominal: Soft. There is no tenderness. There is no guarding.  Musculoskeletal: Normal range of motion. He exhibits no edema.  Neurological: No cranial nerve deficit.  Oriented to person and place. Finger to nose intact. No nystagmus. No unsteadiness.  Skin: Skin is warm and dry. No rash noted. He is not diaphoretic.  Psychiatric: He has a normal mood and affect.  Nursing note and vitals reviewed.   ED Course  Procedures (including critical care time) Labs Review Labs Reviewed  BASIC METABOLIC PANEL - Abnormal; Notable for the following:    Sodium 133 (*)    Potassium 3.4 (*)    BUN 21 (*)    Calcium 8.7 (*)    All other components within normal limits  CBC - Abnormal; Notable for the following:    Hemoglobin 12.8 (*)    HCT 38.2 (*)    All other components within normal limits  URINALYSIS, ROUTINE W REFLEX MICROSCOPIC (NOT AT [facility]) - Abnormal; Notable for the following:    APPearance HAZY (*)    All other components within normal limits  URINE CULTURE  LIPASE, BLOOD  CBG  MONITORING, ED  I-STAT TROPOININ, ED  I-STAT CG4 LACTIC ACID, ED    Imaging Review Ct Head Wo Contrast  01/07/2015  CLINICAL DATA:  Headache for 2 days.  Neck soreness. EXAM: CT HEAD WITHOUT CONTRAST CT CERVICAL SPINE WITHOUT CONTRAST TECHNIQUE: Multidetector CT imaging of the head and cervical spine was performed following the standard protocol without intravenous contrast. Multiplanar CT image reconstructions of the cervical spine were also generated. COMPARISON:  01/25/2011 FINDINGS: CT HEAD FINDINGS Global atrophy appropriate to age. Minimal chronic ischemic changes in the periventricular white matter. No mass effect, midline shift, or  acute hemorrhage. Mastoid air cells are clear. CT CERVICAL SPINE FINDINGS No acute fracture. No dislocation. There is apparent fusion from C3 through C7 secondary to bridging vertebral osteophytes as well as ankylosis of some of the left facet joints. There is no obvious spinal hematoma. Posterior osteophytic ridging at multiple levels does encroach upon the central canal. IMPRESSION: No acute intracranial pathology. Atrophy and chronic ischemic changes are noted. No acute cervical spine injury. Electronically Signed   By: Marybelle Killings M.D.   On: 01/07/2015 10:49   Ct Cervical Spine Wo Contrast  01/07/2015  CLINICAL DATA:  Headache for 2 days.  Neck soreness. EXAM: CT HEAD WITHOUT CONTRAST CT CERVICAL SPINE WITHOUT CONTRAST TECHNIQUE: Multidetector CT imaging of the head and cervical spine was performed following the standard protocol without intravenous contrast. Multiplanar CT image reconstructions of the cervical spine were also generated. COMPARISON:  01/25/2011 FINDINGS: CT HEAD FINDINGS Global atrophy appropriate to age. Minimal chronic ischemic changes in the periventricular white matter. No mass effect, midline shift, or acute hemorrhage. Mastoid air cells are clear. CT CERVICAL SPINE FINDINGS No acute fracture. No dislocation. There is apparent fusion from C3  through C7 secondary to bridging vertebral osteophytes as well as ankylosis of some of the left facet joints. There is no obvious spinal hematoma. Posterior osteophytic ridging at multiple levels does encroach upon the central canal. IMPRESSION: No acute intracranial pathology. Atrophy and chronic ischemic changes are noted. No acute cervical spine injury. Electronically Signed   By: Marybelle Killings M.D.   On: 01/07/2015 10:49   I have personally reviewed and evaluated these images and lab results as part of my medical decision-making.   EKG Interpretation   Date/Time:  Saturday January 07 2015 07:38:55 EST Ventricular Rate:  51 PR Interval:  236 QRS Duration: 153 QT Interval:  494 QTC Calculation: 455 R Axis:   81 Text Interpretation:  Sinus rhythm Prolonged PR interval IVCD, consider  atypical LBBB Baseline wander in lead(s) V3 V6 ED PHYSICIAN INTERPRETATION  AVAILABLE IN [facility] Confirmed by TEST, Record ([id number]) on  01/07/2015 10:00:08 AM      MDM   Final diagnoses:  Dizziness   patient is an 81 year old male presenting with vague symptoms of mild headache and dizziness. Patient states that he is dizzy at rest. However he has no nystagmus and no evidence of vertigo no vomiting. In talking to patient's daughter it appears that he has been ill for the last 2 days with nausea and vomiting and diarrhea. We will get baseline labs, CT on patient.  Given his residual dizziness, we will get MRI.  Patient refusing MRI. Asking to leave, would like to return home.   Discussed with patient's daughter, Lysbeth Galas.  Pt lives at home alone.S he states she is comfortabel wtihout the full work up. She has a caretaker go in and see him. However patient refuses to accept help and patient's family is comfortable with the fact that he is doing what he wants at his age. It a similar vein patient does not want further workup here and we will respect his autonomy.  We said that we think he requires  admission, but she is comfortable risking death and disability for patient to return home. Patient agrees.   Pts daughter is Lysbeth Galas [phone].  Care taker is Orlo Hopp [id number]  Sharay Bellissimo Julio Alm, MD 01/07/15 [place], MD 01/07/15 [phone]

## 2015-01-07 NOTE — ED Notes (Signed)
Pt brought in EMS for intermittent dizziness and nausea x2 days.  Pt denies any vomiting or LOC.  Pt reports when he has these episodes it feels as if the room is spinning and it makes him nauseous.

## 2015-01-08 LAB — URINE CULTURE: CULTURE: NO GROWTH

## 2015-01-19 ENCOUNTER — Ambulatory Visit: Payer: Medicare Other | Admitting: Neurology

## 2015-01-20 ENCOUNTER — Encounter: Payer: Self-pay | Admitting: Neurology

## 2015-02-27 DIAGNOSIS — X32XXXD Exposure to sunlight, subsequent encounter: Secondary | ICD-10-CM | POA: Diagnosis not present

## 2015-02-27 DIAGNOSIS — C44229 Squamous cell carcinoma of skin of left ear and external auricular canal: Secondary | ICD-10-CM | POA: Diagnosis not present

## 2015-02-27 DIAGNOSIS — C4442 Squamous cell carcinoma of skin of scalp and neck: Secondary | ICD-10-CM | POA: Diagnosis not present

## 2015-02-27 DIAGNOSIS — L570 Actinic keratosis: Secondary | ICD-10-CM | POA: Diagnosis not present

## 2015-06-05 DIAGNOSIS — D225 Melanocytic nevi of trunk: Secondary | ICD-10-CM | POA: Diagnosis not present

## 2015-06-05 DIAGNOSIS — X32XXXD Exposure to sunlight, subsequent encounter: Secondary | ICD-10-CM | POA: Diagnosis not present

## 2015-06-05 DIAGNOSIS — L570 Actinic keratosis: Secondary | ICD-10-CM | POA: Diagnosis not present

## 2015-06-05 DIAGNOSIS — L821 Other seborrheic keratosis: Secondary | ICD-10-CM | POA: Diagnosis not present

## 2015-06-06 DIAGNOSIS — K219 Gastro-esophageal reflux disease without esophagitis: Secondary | ICD-10-CM | POA: Diagnosis not present

## 2015-06-06 DIAGNOSIS — I10 Essential (primary) hypertension: Secondary | ICD-10-CM | POA: Diagnosis not present

## 2015-06-06 DIAGNOSIS — F0390 Unspecified dementia without behavioral disturbance: Secondary | ICD-10-CM | POA: Diagnosis not present

## 2015-06-06 DIAGNOSIS — E785 Hyperlipidemia, unspecified: Secondary | ICD-10-CM | POA: Diagnosis not present

## 2015-06-06 DIAGNOSIS — G4700 Insomnia, unspecified: Secondary | ICD-10-CM | POA: Diagnosis not present

## 2015-06-07 DIAGNOSIS — K219 Gastro-esophageal reflux disease without esophagitis: Secondary | ICD-10-CM | POA: Diagnosis not present

## 2015-06-07 DIAGNOSIS — R8290 Unspecified abnormal findings in urine: Secondary | ICD-10-CM | POA: Diagnosis not present

## 2015-06-07 DIAGNOSIS — G4700 Insomnia, unspecified: Secondary | ICD-10-CM | POA: Diagnosis not present

## 2015-06-07 DIAGNOSIS — E785 Hyperlipidemia, unspecified: Secondary | ICD-10-CM | POA: Diagnosis not present

## 2015-06-07 DIAGNOSIS — I10 Essential (primary) hypertension: Secondary | ICD-10-CM | POA: Diagnosis not present

## 2015-06-07 DIAGNOSIS — R112 Nausea with vomiting, unspecified: Secondary | ICD-10-CM | POA: Diagnosis not present

## 2015-07-21 ENCOUNTER — Telehealth: Payer: Self-pay | Admitting: Neurology

## 2015-07-21 ENCOUNTER — Ambulatory Visit: Payer: Medicare Other | Admitting: Neurology

## 2015-07-21 NOTE — Telephone Encounter (Signed)
Daughter in law/ Jan Viana [phone] called to advise patient will be approximately 10 minutes late for 9:00am appointment with Dr. Erlinda Hong (patient to arrive 30 minutes prior to 9:00am). Jan states, they are having a hard time getting him to come, states he went out for a walk earlier, walked the dog and came back and now he refuses to come, he is telling them "He's 82 yrs old and he's not coming" (this will be 2nd No Show/Cx <24 hrs for New Patient Appointment). Jan still wants to try and bring him, states he won't even look at them now, he's mad at them. Jan advised, we will keep appointment on the schedule for now and we will still see patient up to 9:10am, beyond 9:10am we will need to reschedule.

## 2015-07-24 ENCOUNTER — Encounter: Payer: Self-pay | Admitting: Neurology

## 2015-10-13 DIAGNOSIS — F0390 Unspecified dementia without behavioral disturbance: Secondary | ICD-10-CM | POA: Diagnosis not present

## 2015-10-18 DIAGNOSIS — X32XXXD Exposure to sunlight, subsequent encounter: Secondary | ICD-10-CM | POA: Diagnosis not present

## 2015-10-18 DIAGNOSIS — L570 Actinic keratosis: Secondary | ICD-10-CM | POA: Diagnosis not present

## 2015-10-18 DIAGNOSIS — C44529 Squamous cell carcinoma of skin of other part of trunk: Secondary | ICD-10-CM | POA: Diagnosis not present

## 2015-10-18 DIAGNOSIS — D045 Carcinoma in situ of skin of trunk: Secondary | ICD-10-CM | POA: Diagnosis not present

## 2015-11-13 DIAGNOSIS — R05 Cough: Secondary | ICD-10-CM | POA: Diagnosis not present

## 2015-11-15 DIAGNOSIS — Z85828 Personal history of other malignant neoplasm of skin: Secondary | ICD-10-CM | POA: Diagnosis not present

## 2015-11-15 DIAGNOSIS — Z08 Encounter for follow-up examination after completed treatment for malignant neoplasm: Secondary | ICD-10-CM | POA: Diagnosis not present

## 2015-12-18 ENCOUNTER — Other Ambulatory Visit: Payer: Self-pay | Admitting: *Deleted

## 2015-12-18 DIAGNOSIS — I6523 Occlusion and stenosis of bilateral carotid arteries: Secondary | ICD-10-CM

## 2016-01-10 ENCOUNTER — Encounter (HOSPITAL_COMMUNITY): Payer: No Typology Code available for payment source

## 2016-01-10 ENCOUNTER — Encounter: Payer: Self-pay | Admitting: Vascular Surgery

## 2016-01-10 ENCOUNTER — Ambulatory Visit: Payer: No Typology Code available for payment source | Admitting: Vascular Surgery

## 2016-01-17 ENCOUNTER — Ambulatory Visit (INDEPENDENT_AMBULATORY_CARE_PROVIDER_SITE_OTHER): Payer: Medicare Other | Admitting: Vascular Surgery

## 2016-01-17 ENCOUNTER — Ambulatory Visit (HOSPITAL_COMMUNITY)
Admission: RE | Admit: 2016-01-17 | Discharge: 2016-01-17 | Disposition: A | Discharge status: Home / Self Care | Payer: Medicare Other | Source: Ambulatory Visit | Attending: Vascular Surgery | Admitting: Vascular Surgery

## 2016-01-17 ENCOUNTER — Encounter: Payer: Self-pay | Admitting: Vascular Surgery

## 2016-01-17 VITALS — BP 150/79 | HR 56 | Temp 97.3°F | Resp 18 | Ht 72.0 in | Wt 138.5 lb

## 2016-01-17 DIAGNOSIS — I6521 Occlusion and stenosis of right carotid artery: Secondary | ICD-10-CM | POA: Diagnosis not present

## 2016-01-17 DIAGNOSIS — I6523 Occlusion and stenosis of bilateral carotid arteries: Secondary | ICD-10-CM | POA: Insufficient documentation

## 2016-01-17 LAB — VAS US CAROTID
LEFT ECA DIAS: 5 cm/s
LEFT VERTEBRAL DIAS: 11 cm/s
LICADSYS: -94 cm/s
Left CCA dist dias: 9 cm/s
Left CCA dist sys: 76 cm/s
Left CCA prox dias: 15 cm/s
Left CCA prox sys: 105 cm/s
Left ICA dist dias: -16 cm/s
Left ICA prox dias: -16 cm/s
Left ICA prox sys: -82 cm/s
RCCADSYS: -76 cm/s
RCCAPDIAS: 5 cm/s
RIGHT CCA MID DIAS: 9 cm/s
RIGHT ECA DIAS: -3 cm/s
RIGHT VERTEBRAL DIAS: 9 cm/s
Right CCA prox sys: 63 cm/s

## 2016-01-17 NOTE — Progress Notes (Signed)
Patient name: Thomas Mccarty. MRN: [id number] DOB: 10/01/1925 Sex: male  REASON FOR VISIT: Follow up of carotid disease.  HPI: Thomas Mccarty. is a 82 y.o. male who underwent a right carotid endarterectomy with primary closure in February 2013. In reviewing his records, it looks like he was lost to follow up. He was referred back by Marda Stalker, PA.  Of note I have reviewed the records from the referring office and he does have a history of dementia. In addition, he has a history of insomnia, hyperlipidemia, hypertension, and esophageal reflux.  Since I saw him last, he denies any history of stroke, TIAs, expressive or receptive aphasia, or amaurosis fugax. He is on a statin. He states that he does not take aspirin.  Past Medical History:  Diagnosis Date  . Back pain   . Cancer ([place]) 10/01/11   Back - Melanoma  . Carotid stenosis    right  . Complication of anesthesia    has severe memory loss a week post op 8/13  . Dysuria   . Early cataracts, bilateral   . GERD (gastroesophageal reflux disease)    takes Omeprazole daily  . Hypercholesteremia    takes lovastatin daily  . Hypertension    takes Lisinopril daily  . Impaired hearing   . Keratosis   . Macular degeneration    dry  . Spinal stenosis   . Spinal stenosis     Family History  Problem Relation Age of Onset  . Ulcers Mother   . Anesthesia problems Neg Hx   . Hypotension Neg Hx   . Malignant hyperthermia Neg Hx   . Pseudochol deficiency Neg Hx     SOCIAL HISTORY: Social History  Substance Use Topics  . Smoking status: Former Smoker    Types: Cigarettes, Pipe    Quit date: 12/03/1962  . Smokeless tobacco: Former Systems developer     Comment: quit 40+yrs ago  . Alcohol use 0.6 oz/week    1 Glasses of wine per week     Comment: 1 glass of wine every night with evening mean    Allergies  Allergen Reactions  . Codeine     Current Outpatient Prescriptions  Medication Sig Dispense Refill  . amLODipine  (NORVASC) 2.5 MG tablet Take 2.5 mg by mouth daily.     Marland Kitchen aspirin 325 MG tablet Take 325 mg by mouth daily.    . Calcium Carbonate-Vitamin D (CALCIUM 600 + D PO) Take 1 tablet by mouth daily.     Marland Kitchen lisinopril (PRINIVIL,ZESTRIL) 10 MG tablet Take 10 mg by mouth daily.     Marland Kitchen LORazepam (ATIVAN) 0.5 MG tablet Take 0.5-1 tablets by mouth at bedtime as needed.    . meclizine (ANTIVERT) 25 MG tablet Take 1 tablet by mouth 3 (three) times daily as needed.  0  . omeprazole (PRILOSEC) 20 MG capsule Take 20 mg by mouth daily.      . ondansetron (ZOFRAN) 4 MG tablet Take 1 tablet (4 mg total) by mouth every 8 (eight) hours as needed for nausea or vomiting. 11 tablet 0  . simvastatin (ZOCOR) 20 MG tablet Take 20 mg by mouth daily.     . vitamin E 400 UNIT capsule Take 400 Units by mouth daily.       No current facility-administered medications for this visit.     REVIEW OF SYSTEMS:  [X]  denotes positive finding, [ ]  denotes negative finding Cardiac  Comments:  Chest pain or chest pressure:  Shortness of breath upon exertion:    Short of breath when lying flat:    Irregular heart rhythm:        Vascular    Pain in calf, thigh, or hip brought on by ambulation:    Pain in feet at night that wakes you up from your sleep:     Blood clot in your veins:    Leg swelling:         Pulmonary    Oxygen at home:    Productive cough:     Wheezing:         Neurologic    Sudden weakness in arms or legs:     Sudden numbness in arms or legs:     Sudden onset of difficulty speaking or slurred speech:    Temporary loss of vision in one eye:     Problems with dizziness:         Gastrointestinal    Blood in stool:     Vomited blood:         Genitourinary    Burning when urinating:     Blood in urine:        Psychiatric    Major depression:         Hematologic    Bleeding problems:    Problems with blood clotting too easily:        Skin    Rashes or ulcers:        Constitutional    Fever or  chills:      PHYSICAL EXAM: Vitals:   01/17/16 1140  BP: (!) 168/74  Pulse: (!) 56  Temp: 97.3 F (36.3 C)  TempSrc: Oral  SpO2: 98%  Weight: 138 lb 8 oz (62.8 kg)  Height: 6' (1.829 m)    GENERAL: The patient is a well-nourished male, in no acute distress. The vital signs are documented above. CARDIAC: There is a regular rate and rhythm.  VASCULAR: I do not detect carotid bruits. He has palpable pedal pulses bilaterally. He has no significant lower extremity swelling. PULMONARY: There is good air exchange bilaterally without wheezing or rales. ABDOMEN: Soft and non-tender with normal pitched bowel sounds.  MUSCULOSKELETAL: There are no major deformities or cyanosis. NEUROLOGIC: No focal weakness or paresthesias are detected. SKIN: There are no ulcers or rashes noted. He has a callus on the plantar aspect of his left great toe. PSYCHIATRIC: The patient has a normal affect.  DATA:   CAROTID DUPLEX: I have independently interpreted his carotid duplex can today.  On the right side, the right carotid endarterectomy site is widely patent without evidence of restenosis.  On the left side he has a less than 39% stenosis.  Both vertebral arteries are patent with antegrade flow.  MEDICAL ISSUES:  STATUS POST RIGHT CAROTID ENDARTERECTOMY: The patient is doing well status post right carotid endarterectomy. There is no evidence of restenosis. He has no significant carotid stenosis on the left. He is on a statin. I have instructed him to begin taking an aspirin daily. 81 mg is fine if he would prefer. We will follow him once here on our protocol and we will have him see the nurse practitioner or PA in 1 year. He knows to call sooner if he has problems. Fortunately he is not a smoker.   Deitra Mayo [facility] [phone]

## 2016-01-23 NOTE — Addendum Note (Signed)
Addended by: Lianne Cure A on: 01/23/2016 10:04 AM   Modules accepted: Orders

## 2016-03-27 DIAGNOSIS — L84 Corns and callosities: Secondary | ICD-10-CM | POA: Diagnosis not present

## 2016-03-27 DIAGNOSIS — C44629 Squamous cell carcinoma of skin of left upper limb, including shoulder: Secondary | ICD-10-CM | POA: Diagnosis not present

## 2016-03-27 DIAGNOSIS — C44622 Squamous cell carcinoma of skin of right upper limb, including shoulder: Secondary | ICD-10-CM | POA: Diagnosis not present

## 2016-05-02 DIAGNOSIS — C44729 Squamous cell carcinoma of skin of left lower limb, including hip: Secondary | ICD-10-CM | POA: Diagnosis not present

## 2016-05-02 DIAGNOSIS — C44329 Squamous cell carcinoma of skin of other parts of face: Secondary | ICD-10-CM | POA: Diagnosis not present

## 2016-05-02 DIAGNOSIS — Z85828 Personal history of other malignant neoplasm of skin: Secondary | ICD-10-CM | POA: Diagnosis not present

## 2016-06-20 DIAGNOSIS — R21 Rash and other nonspecific skin eruption: Secondary | ICD-10-CM | POA: Diagnosis not present

## 2016-07-11 DIAGNOSIS — Z08 Encounter for follow-up examination after completed treatment for malignant neoplasm: Secondary | ICD-10-CM | POA: Diagnosis not present

## 2016-07-11 DIAGNOSIS — D0471 Carcinoma in situ of skin of right lower limb, including hip: Secondary | ICD-10-CM | POA: Diagnosis not present

## 2016-07-11 DIAGNOSIS — C44729 Squamous cell carcinoma of skin of left lower limb, including hip: Secondary | ICD-10-CM | POA: Diagnosis not present

## 2016-07-11 DIAGNOSIS — Z85828 Personal history of other malignant neoplasm of skin: Secondary | ICD-10-CM | POA: Diagnosis not present

## 2016-07-11 DIAGNOSIS — I872 Venous insufficiency (chronic) (peripheral): Secondary | ICD-10-CM | POA: Diagnosis not present

## 2016-08-29 DIAGNOSIS — L0390 Cellulitis, unspecified: Secondary | ICD-10-CM | POA: Diagnosis not present

## 2016-08-29 DIAGNOSIS — L03116 Cellulitis of left lower limb: Secondary | ICD-10-CM | POA: Diagnosis not present

## 2016-08-29 DIAGNOSIS — L03115 Cellulitis of right lower limb: Secondary | ICD-10-CM | POA: Diagnosis not present

## 2016-09-05 ENCOUNTER — Other Ambulatory Visit: Payer: Self-pay | Admitting: Gastroenterology

## 2016-09-05 DIAGNOSIS — R1310 Dysphagia, unspecified: Secondary | ICD-10-CM

## 2016-09-05 DIAGNOSIS — F0390 Unspecified dementia without behavioral disturbance: Secondary | ICD-10-CM | POA: Diagnosis not present

## 2016-09-12 ENCOUNTER — Other Ambulatory Visit: Payer: No Typology Code available for payment source

## 2016-11-28 DIAGNOSIS — C44729 Squamous cell carcinoma of skin of left lower limb, including hip: Secondary | ICD-10-CM | POA: Diagnosis not present

## 2016-12-03 ENCOUNTER — Encounter (HOSPITAL_COMMUNITY): Payer: Self-pay | Admitting: Emergency Medicine

## 2016-12-03 ENCOUNTER — Emergency Department (HOSPITAL_COMMUNITY)
Admission: EM | Admit: 2016-12-03 | Discharge: 2016-12-03 | Disposition: A | Discharge status: Home / Self Care | Payer: Medicare Other | Attending: Emergency Medicine | Admitting: Emergency Medicine

## 2016-12-03 DIAGNOSIS — F0390 Unspecified dementia without behavioral disturbance: Secondary | ICD-10-CM | POA: Diagnosis not present

## 2016-12-03 DIAGNOSIS — L989 Disorder of the skin and subcutaneous tissue, unspecified: Secondary | ICD-10-CM | POA: Diagnosis not present

## 2016-12-03 DIAGNOSIS — Z7982 Long term (current) use of aspirin: Secondary | ICD-10-CM | POA: Insufficient documentation

## 2016-12-03 DIAGNOSIS — I10 Essential (primary) hypertension: Secondary | ICD-10-CM | POA: Diagnosis not present

## 2016-12-03 DIAGNOSIS — Z85820 Personal history of malignant melanoma of skin: Secondary | ICD-10-CM | POA: Diagnosis not present

## 2016-12-03 DIAGNOSIS — M79604 Pain in right leg: Secondary | ICD-10-CM | POA: Diagnosis not present

## 2016-12-03 DIAGNOSIS — M79605 Pain in left leg: Secondary | ICD-10-CM | POA: Diagnosis not present

## 2016-12-03 MED ORDER — DOXYCYCLINE HYCLATE 100 MG PO CAPS: 100.0000 mg | ORAL_CAPSULE | Freq: Two times a day (BID) | ORAL | 0 refills | Status: AC

## 2016-12-03 MED ORDER — DOXYCYCLINE HYCLATE 100 MG PO TABS
100.0000 mg | ORAL_TABLET | Freq: Once | ORAL | Status: AC
  Administered 2016-12-03: 100 mg via ORAL
  Filled 2016-12-03: qty 1

## 2016-12-03 NOTE — ED Provider Notes (Signed)
[facility] DEPT Provider Note   CSN: [id number] Arrival date & time: 12/03/16  1515     History   Chief Complaint Chief Complaint  Patient presents with  . Wound Infection    HPI Thomas Mccarty. is a 83 y.o. male with a history of dementia and melanoma who presents to the emergency department with a chief complaint of worsening, constant bilateral leg pain and skin lesions over the last few months. He reports he was evaluated by his dermatologist last week and was advised to follow up with surgery due to worsening areas of skin cancer on his bilateral lower legs. The patient's son reports that the patient's daughter, who is a Hotel manager, stated that he needed to to the ED for evaluation because he was "going to die from a blood infection.  He denies fever, chills, discharge, redness, warmth or swelling from the bilateral lower extremities, chest pain, or dyspnea. No treatment PTA.   The patient's son reports he walks about 1 mile a day at baseline.  He has not not been evaluated by general surgery.  HPI  Past Medical History:  Diagnosis Date  . Back pain   . Cancer ([place]) 10/01/11   Back - Melanoma  . Carotid stenosis    right  . Complication of anesthesia    has severe memory loss a week post op 8/13  . Dysuria   . Early cataracts, bilateral   . GERD (gastroesophageal reflux disease)    takes Omeprazole daily  . Hypercholesteremia    takes lovastatin daily  . Hypertension    takes Lisinopril daily  . Impaired hearing   . Keratosis   . Macular degeneration    dry  . Spinal stenosis   . Spinal stenosis     Patient Active Problem List   Diagnosis Date Noted  . Melanoma of back ([place]) 09/17/2011  . Occlusion and stenosis of carotid artery without mention of cerebral infarction 03/13/2011  . Carotid stenosis 01/26/2011  . Vertigo 01/25/2011  . HTN (hypertension) 01/25/2011  . Spinal stenosis 01/25/2011  . GERD  (gastroesophageal reflux disease) 01/25/2011  . Hyperlipidemia 01/25/2011    Past Surgical History:  Procedure Laterality Date  . APPENDECTOMY     as a child  . CAROTID ENDARTERECTOMY  03/19/11   RIGHT  cea  . CHOLECYSTECTOMY  approx. 1992  . ENDARTERECTOMY  03/19/2011   Procedure: ENDARTERECTOMY CAROTID;  Surgeon: Angelia Mould, MD;  Location[facility] OR;  Service: Vascular;  Laterality: Right;  Right Carotid Endarterectomy with Primary Closure  . MELANOMA EXCISION Right 12/08/2012   Procedure: wide local excision right back MELANOMA EXCISION;  Surgeon: Stark Klein, MD;  Location: [facility];  Service: General;  Laterality: Right;  . MELANOMA EXCISION WITH SENTINEL LYMPH NODE BIOPSY  8/13   melanoma removed back with skin graft-snbx lt axillary       Home Medications    Prior to Admission medications   Medication Sig Start Date End Date Taking? Authorizing Provider  amLODipine (NORVASC) 2.5 MG tablet Take 2.5 mg by mouth daily.  11/05/12   [provider]  aspirin 325 MG tablet Take 325 mg by mouth daily.    [provider]  Calcium Carbonate-Vitamin D (CALCIUM 600 + D PO) Take 1 tablet by mouth daily.     [provider]  doxycycline (VIBRAMYCIN) 100 MG capsule Take 1 capsule (100 mg total) by mouth 2 (two) times daily. 12/03/16 12/08/16  Demarqus Jocson A, PA-C  lisinopril (PRINIVIL,ZESTRIL) 10 MG tablet Take 10 mg by mouth daily.     [provider]  LORazepam (ATIVAN) 0.5 MG tablet Take 0.5-1 tablets by mouth at bedtime as needed. 08/26/14   [provider]  meclizine (ANTIVERT) 25 MG tablet Take 1 tablet by mouth 3 (three) times daily as needed. 10/20/14   [provider]  Multiple Vitamin (MULTIVITAMIN) capsule Take 1 capsule by mouth daily.    [provider]  omeprazole (PRILOSEC) 20 MG capsule Take 20 mg by mouth daily.      [provider]  ondansetron (ZOFRAN) 4 MG tablet Take 1 tablet (4  mg total) by mouth every 8 (eight) hours as needed for nausea or vomiting. 01/07/15   Mackuen, Courteney Lyn, MD  simvastatin (ZOCOR) 20 MG tablet Take 20 mg by mouth daily.  09/10/11   [provider]  vitamin E 400 UNIT capsule Take 400 Units by mouth daily.      [provider]    Family History Family History  Problem Relation Age of Onset  . Ulcers Mother   . Anesthesia problems Neg Hx   . Hypotension Neg Hx   . Malignant hyperthermia Neg Hx   . Pseudochol deficiency Neg Hx     Social History Social History  Substance Use Topics  . Smoking status: Former Smoker    Types: Cigarettes, Pipe    Quit date: 12/03/1962  . Smokeless tobacco: Former Systems developer     Comment: quit 40+yrs ago  . Alcohol use 0.6 oz/week    1 Glasses of wine per week     Comment: 1 glass of wine every night with evening mean     Allergies   Codeine   Review of Systems Review of Systems  Constitutional: Negative for activity change, chills and fever.  Respiratory: Negative for shortness of breath.   Cardiovascular: Negative for chest pain.  Gastrointestinal: Negative for abdominal pain.  Musculoskeletal: Positive for arthralgias and myalgias. Negative for back pain, gait problem and joint swelling.  Skin: Negative for rash.     Physical Exam Updated Vital Signs BP (!) 188/67 (BP Location: Left Arm)   Pulse 62   Temp 97.6 F (36.4 C) (Oral)   Resp 16   Ht 6' (1.829 m)   Wt 68 kg (150 lb)   SpO2 93%   BMI 20.34 kg/m   Physical Exam  Constitutional: He appears well-developed.  HENT:  Head: Normocephalic.  Eyes: Conjunctivae are normal.  Neck: Neck supple.  Cardiovascular: Normal rate and regular rhythm.   No murmur heard. Pulmonary/Chest: Effort normal.  Abdominal: Soft. He exhibits no distension.  Musculoskeletal: He exhibits edema. He exhibits no deformity.  See pictures below.  Erythema noted to the bilateral lower legs. No warmth.  DP and PT pulses are 2+ and  symmetric.  Symmetric, shuffling gait without difficulty.  Moves all toes independently.  Sensation is intact throughout the bilateral lower extremities.  5 out of 5 strength against resistance with dorsiflexion and plantarflexion of the bilateral lower extremities.  Neurological: He is alert.  Skin: Skin is warm and dry.  Psychiatric: His behavior is normal.  Nursing note and vitals reviewed.              ED Treatments / Results  Labs (all labs ordered are listed, but only abnormal results are displayed) Labs Reviewed - No data to display  EKG  EKG Interpretation None  Radiology No results found.  Procedures Procedures (including critical care time)  Medications Ordered in ED Medications  doxycycline (VIBRA-TABS) tablet 100 mg (100 mg Oral Given 12/03/16 1833)     Initial Impression / Assessment and Plan / ED Course  I have reviewed the triage vital signs and the nursing notes.  Pertinent labs & imaging results that were available during my care of the patient were reviewed by me and considered in my medical decision making (see chart for details).     83 year old male with a history of melanoma skin cancer presenting with gradually worsening skin lesions and erythema over the last few months.  No warmth noted to the bilateral lower extremities. NVI. Afebrile in the ED. The patient was discussed and evaluated with Dr. Regenia Skeeter, attending physician.  Doubt cellulitis, osteomyelitis, lymphedema, or DVT at this time.  Will provide the patient with an outpatient referral to general surgery. Will d/c the patient with a course of doxycycline for wound prophylaxis.  Strict return precautions given.  No acute distress.  VSS. The patient is safe for discharge at this time.  Final Clinical Impressions(s) / ED Diagnoses   Final diagnoses:  Leg pain, bilateral    New Prescriptions Discharge Medication List as of 12/03/2016  6:17 PM    START taking these  medications   Details  doxycycline (VIBRAMYCIN) 100 MG capsule Take 1 capsule (100 mg total) by mouth 2 (two) times daily., Starting Tue 12/03/2016, Until Sun 12/08/2016, Print         Nawal Burling A, PA-C 12/04/16 6578    Sherwood Gambler, MD 12/04/16 2306

## 2016-12-03 NOTE — ED Triage Notes (Signed)
Patient BIB son, reports sent by dermatologist for worsening wounds to bilateral lower legs. Reports skin cancer and infection has been worsening since August. Denies fevers. Ambulatory. Redness to wounds. Hx dementia.

## 2016-12-03 NOTE — Discharge Instructions (Signed)
Take 1 tablet of doxycycline morning and night for the next 5 days.  This medication is an antibiotic and can cover for infections of the skin.  Please call Dr. Lear Ng office to schedule an appointment with surgery. Tell them you are follow up from the Emergency Department and Dermatology.  600 mg of ibuprofen can be give once every 6-8 hours for pain control.   If he develops any worsening symptoms including redness of the legs, fever, or if the legs begin to drain thick, white discharge, or if develops any other concerning symptoms, please return to the emergency department for reevaluation.

## 2016-12-13 DIAGNOSIS — C44722 Squamous cell carcinoma of skin of right lower limb, including hip: Secondary | ICD-10-CM | POA: Diagnosis not present

## 2016-12-13 DIAGNOSIS — M793 Panniculitis, unspecified: Secondary | ICD-10-CM | POA: Diagnosis not present

## 2017-01-06 DIAGNOSIS — C44722 Squamous cell carcinoma of skin of right lower limb, including hip: Secondary | ICD-10-CM | POA: Diagnosis not present

## 2017-01-06 DIAGNOSIS — C44729 Squamous cell carcinoma of skin of left lower limb, including hip: Secondary | ICD-10-CM | POA: Diagnosis not present

## 2017-01-07 DIAGNOSIS — IMO0002 Reserved for concepts with insufficient information to code with codable children: Secondary | ICD-10-CM | POA: Insufficient documentation

## 2017-01-17 ENCOUNTER — Ambulatory Visit: Payer: Medicare Other | Admitting: Podiatry

## 2017-01-22 ENCOUNTER — Encounter (HOSPITAL_COMMUNITY): Payer: No Typology Code available for payment source

## 2017-01-22 ENCOUNTER — Ambulatory Visit: Payer: No Typology Code available for payment source | Admitting: Family

## 2017-01-23 ENCOUNTER — Ambulatory Visit (INDEPENDENT_AMBULATORY_CARE_PROVIDER_SITE_OTHER): Payer: Medicare Other | Admitting: Podiatry

## 2017-01-23 ENCOUNTER — Encounter: Payer: Self-pay | Admitting: Podiatry

## 2017-01-23 VITALS — Ht 72.0 in | Wt 145.0 lb

## 2017-01-23 DIAGNOSIS — M79609 Pain in unspecified limb: Principal | ICD-10-CM

## 2017-01-23 DIAGNOSIS — B351 Tinea unguium: Secondary | ICD-10-CM

## 2017-01-23 DIAGNOSIS — M79676 Pain in unspecified toe(s): Secondary | ICD-10-CM

## 2017-02-03 NOTE — Progress Notes (Signed)
Subjective:  Patient ID: Thomas Burly., male    DOB: 01-10-1926,  MRN: [id number]  Chief Complaint  Patient presents with  . Nail Problem    bilateral elongated thickened toenails  . Callouses    bilateral, painful   83 y.o. male presents with the above complaint.  Reports painful elongated nails to both feet.   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  Due to patient's age it was discussed that he may not benefit from surgical resection. Past Medical History:  Diagnosis Date  . Back pain   . Cancer ([place]) 10/01/11   Back - Melanoma  . Carotid stenosis    right  . Complication of anesthesia    has severe memory loss a week post op 8/13  . Dysuria   . Early cataracts, bilateral   . GERD (gastroesophageal reflux disease)    takes Omeprazole daily  . Hypercholesteremia    takes lovastatin daily  . Hypertension    takes Lisinopril daily  . Impaired hearing   . Keratosis   . Macular degeneration    dry  . Spinal stenosis   . Spinal stenosis    Past Surgical History:  Procedure Laterality Date  . APPENDECTOMY     as a child  . CAROTID ENDARTERECTOMY  03/19/11   RIGHT  cea  . CHOLECYSTECTOMY  approx. 1992  . ENDARTERECTOMY  03/19/2011   Procedure: ENDARTERECTOMY CAROTID;  Surgeon: Angelia Mould, MD;  Location: [facility] OR;  Service: Vascular;  Laterality: Right;  Right Carotid Endarterectomy with Primary Closure  . MELANOMA EXCISION Right 12/08/2012   Procedure: wide local excision right back MELANOMA EXCISION;  Surgeon: Stark Klein, MD;  Location: [facility];  Service: General;  Laterality: Right;  . MELANOMA EXCISION WITH SENTINEL LYMPH NODE BIOPSY  8/13   melanoma removed back with skin graft-snbx lt axillary    Current Outpatient Medications:  .  diazepam (VALIUM) 5 MG tablet, , Disp: , Rfl:  .  amLODipine (NORVASC) 2.5 MG tablet, Take 2.5 mg by mouth daily. , Disp: , Rfl:  .  aspirin 325 MG tablet, Take 325 mg by mouth daily.,  Disp: , Rfl:  .  Calcium Carbonate-Vitamin D (CALCIUM 600 + D PO), Take 1 tablet by mouth daily. , Disp: , Rfl:  .  lisinopril (PRINIVIL,ZESTRIL) 10 MG tablet, Take 10 mg by mouth daily. , Disp: , Rfl:  .  LORazepam (ATIVAN) 0.5 MG tablet, Take 0.5-1 tablets by mouth at bedtime as needed., Disp: , Rfl:  .  meclizine (ANTIVERT) 25 MG tablet, Take 1 tablet by mouth 3 (three) times daily as needed., Disp: , Rfl: 0 .  Multiple Vitamin (MULTIVITAMIN) capsule, Take 1 capsule by mouth daily., Disp: , Rfl:  .  omeprazole (PRILOSEC) 20 MG capsule, Take 20 mg by mouth daily.  , Disp: , Rfl:  .  ondansetron (ZOFRAN) 4 MG tablet, Take 1 tablet (4 mg total) by mouth every 8 (eight) hours as needed for nausea or vomiting., Disp: 11 tablet, Rfl: 0 .  simvastatin (ZOCOR) 20 MG tablet, Take 20 mg by mouth daily. , Disp: , Rfl:  .  vitamin E 400 UNIT capsule, Take 400 Units by mouth daily.  , Disp: , Rfl:   Allergies  Allergen Reactions  . Codeine    Review of Systems Objective:  There were no vitals filed for this visit. General AA&O x3. Normal mood and affect.  Vascular Dorsalis pedis and posterior tibial  pulses  present 2+ bilaterally  Capillary refill normal to all digits. Pedal hair growth normal.  Neurologic Epicritic sensation grossly present.  Dermatologic No open lesions. Interspaces clear of maceration. Nails x10 elongated and thickened with pain to palpation Left anterior shin mass with hyperkeratosis, black discoloration, pain to palpation  Orthopedic: MMT 5/5 in dorsiflexion, plantarflexion, inversion, and eversion. Normal joint ROM without pain or crepitus.   Assessment & Plan:  Patient was evaluated and treated and all questions answered.  Onychomycosis with pain -Nails palliatively debrided -Follow-up PRN  Squamous cell carcinoma left leg -Advised continued follow-up with plastic surgery/dermatology  No Follow-up on file.

## 2017-03-04 ENCOUNTER — Ambulatory Visit: Payer: No Typology Code available for payment source | Admitting: Family

## 2017-03-04 ENCOUNTER — Encounter (HOSPITAL_COMMUNITY): Payer: No Typology Code available for payment source

## 2017-03-05 DIAGNOSIS — C44722 Squamous cell carcinoma of skin of right lower limb, including hip: Secondary | ICD-10-CM | POA: Diagnosis not present

## 2017-03-12 DIAGNOSIS — C44722 Squamous cell carcinoma of skin of right lower limb, including hip: Secondary | ICD-10-CM | POA: Diagnosis not present

## 2017-03-19 DIAGNOSIS — S81801D Unspecified open wound, right lower leg, subsequent encounter: Secondary | ICD-10-CM | POA: Diagnosis not present

## 2017-03-19 DIAGNOSIS — C44722 Squamous cell carcinoma of skin of right lower limb, including hip: Secondary | ICD-10-CM | POA: Diagnosis not present

## 2017-03-19 DIAGNOSIS — C44729 Squamous cell carcinoma of skin of left lower limb, including hip: Secondary | ICD-10-CM | POA: Diagnosis not present

## 2017-03-26 DIAGNOSIS — C44729 Squamous cell carcinoma of skin of left lower limb, including hip: Secondary | ICD-10-CM | POA: Diagnosis not present

## 2017-03-26 DIAGNOSIS — Z4801 Encounter for change or removal of surgical wound dressing: Secondary | ICD-10-CM | POA: Diagnosis not present

## 2017-03-26 DIAGNOSIS — S81801D Unspecified open wound, right lower leg, subsequent encounter: Secondary | ICD-10-CM | POA: Diagnosis not present

## 2017-04-10 ENCOUNTER — Ambulatory Visit: Payer: Medicare Other | Admitting: Podiatry

## 2017-05-09 ENCOUNTER — Ambulatory Visit (INDEPENDENT_AMBULATORY_CARE_PROVIDER_SITE_OTHER): Payer: Medicare Other | Admitting: Podiatry

## 2017-05-09 DIAGNOSIS — M79609 Pain in unspecified limb: Secondary | ICD-10-CM

## 2017-05-09 DIAGNOSIS — B351 Tinea unguium: Secondary | ICD-10-CM

## 2017-05-28 DIAGNOSIS — C44729 Squamous cell carcinoma of skin of left lower limb, including hip: Secondary | ICD-10-CM | POA: Diagnosis not present

## 2017-06-02 NOTE — Progress Notes (Signed)
  Subjective:  Patient ID: Thomas Mccarty., male    DOB: Jun 26, 1925,  MRN: [id number]  No chief complaint on file.  84 y.o. male returns for the above complaint. Reports continued painful nails  Objective:  There were no vitals filed for this visit. General AA&O x3. Normal mood and affect.  Vascular Pedal pulses palpable.  Neurologic Epicritic sensation grossly intact.  Dermatologic No open lesions. Skin normal texture and turgor. Toenails x 10 elongated, thickened, dystrophic.  Orthopedic: Pain to palpation about the toenails.   Assessment & Plan:  Patient was evaluated and treated and all questions answered.  Onychomycosis with pain  -Nails palliatively debrided as below. -Educated on self-care  Procedure: Nail Debridement Rationale: pain  Type of Debridement: manual, sharp debridement. Instrumentation: Nail nipper, rotary burr. Number of Nails: 10     No follow-ups on file.

## 2017-06-06 ENCOUNTER — Ambulatory Visit: Payer: Medicare Other | Admitting: Podiatry

## 2017-07-09 DIAGNOSIS — C44722 Squamous cell carcinoma of skin of right lower limb, including hip: Secondary | ICD-10-CM | POA: Diagnosis not present

## 2017-07-25 ENCOUNTER — Ambulatory Visit (INDEPENDENT_AMBULATORY_CARE_PROVIDER_SITE_OTHER): Payer: Medicare Other | Admitting: Podiatry

## 2017-07-25 DIAGNOSIS — M79609 Pain in unspecified limb: Secondary | ICD-10-CM

## 2017-07-25 DIAGNOSIS — B351 Tinea unguium: Secondary | ICD-10-CM

## 2017-07-25 NOTE — Progress Notes (Signed)
  Subjective:  Patient ID: Thomas Mccarty., male    DOB: September 17, 1925,  MRN: [id number]  Chief Complaint  Patient presents with  . Callouses    9 week debride   84 y.o. male returns for the above complaint.  Reports painful nails he cannot care for himself reports pain on the bottom of the foot.  Objective:  There were no vitals filed for this visit. General AA&O x3. Normal mood and affect.  Vascular Pedal pulses palpable.  Neurologic Epicritic sensation grossly intact.  Dermatologic No open lesions. Skin normal texture and turgor. Toenails x 10 elongated, thickened, dystrophic. Keratotic lesion first MPJ left  Orthopedic: Pain to palpation about the toenails.   Assessment & Plan:  Patient was evaluated and treated and all questions answered.  Onychomycosis with pain  -Nails palliatively debrided as below. -Urgency debridement of callus left first MPJ -Educated on self-care  Procedure: Nail Debridement Rationale: pain  Type of Debridement: manual, sharp debridement. Instrumentation: Nail nipper, rotary burr. Number of Nails: 10   Return if symptoms worsen or fail to improve.  Patient does not meet criteria for for routine foot care will need ABN prior to be seen next time

## 2017-08-21 DIAGNOSIS — L578 Other skin changes due to chronic exposure to nonionizing radiation: Secondary | ICD-10-CM | POA: Diagnosis not present

## 2017-08-21 DIAGNOSIS — C44729 Squamous cell carcinoma of skin of left lower limb, including hip: Secondary | ICD-10-CM | POA: Diagnosis not present

## 2017-08-21 DIAGNOSIS — L080 Pyoderma: Secondary | ICD-10-CM | POA: Diagnosis not present

## 2017-08-21 DIAGNOSIS — C44722 Squamous cell carcinoma of skin of right lower limb, including hip: Secondary | ICD-10-CM | POA: Diagnosis not present

## 2017-09-24 DIAGNOSIS — C44729 Squamous cell carcinoma of skin of left lower limb, including hip: Secondary | ICD-10-CM | POA: Diagnosis not present

## 2017-09-24 DIAGNOSIS — C44629 Squamous cell carcinoma of skin of left upper limb, including shoulder: Secondary | ICD-10-CM | POA: Diagnosis not present

## 2017-09-24 DIAGNOSIS — C4442 Squamous cell carcinoma of skin of scalp and neck: Secondary | ICD-10-CM | POA: Diagnosis not present

## 2017-09-24 DIAGNOSIS — C44622 Squamous cell carcinoma of skin of right upper limb, including shoulder: Secondary | ICD-10-CM | POA: Diagnosis not present

## 2017-10-10 ENCOUNTER — Ambulatory Visit: Payer: Medicare Other | Admitting: Podiatry

## 2017-10-24 ENCOUNTER — Ambulatory Visit (INDEPENDENT_AMBULATORY_CARE_PROVIDER_SITE_OTHER): Payer: Medicare Other | Admitting: Podiatry

## 2017-10-24 DIAGNOSIS — B351 Tinea unguium: Secondary | ICD-10-CM

## 2017-10-24 DIAGNOSIS — S81802A Unspecified open wound, left lower leg, initial encounter: Secondary | ICD-10-CM | POA: Diagnosis not present

## 2017-10-24 DIAGNOSIS — M79676 Pain in unspecified toe(s): Secondary | ICD-10-CM

## 2017-10-24 DIAGNOSIS — M79609 Pain in unspecified limb: Secondary | ICD-10-CM

## 2017-10-24 NOTE — Progress Notes (Signed)
Subjective:  Patient ID: Thomas Burly., male    DOB: [birth year],  MRN: [id number]  Chief Complaint  Patient presents with  . Nail Problem    3 month debride    84 y.o. male presents with the above complaint.  Reports painfully elongated nails to both feet unable to care for himself.  Also has skin cancer on his leg that has been debrided with a resulting large open wound.  Review of Systems: Negative except as noted in the HPI. Denies N/V/F/Ch.  Past Medical History:  Diagnosis Date  . Back pain   . Cancer ([place]) 10/01/11   Back - Melanoma  . Carotid stenosis    right  . Complication of anesthesia    has severe memory loss a week post op 8/13  . Dysuria   . Early cataracts, bilateral   . GERD (gastroesophageal reflux disease)    takes Omeprazole daily  . Hypercholesteremia    takes lovastatin daily  . Hypertension    takes Lisinopril daily  . Impaired hearing   . Keratosis   . Macular degeneration    dry  . Spinal stenosis   . Spinal stenosis     Current Outpatient Medications:  .  amLODipine (NORVASC) 2.5 MG tablet, Take 2.5 mg by mouth daily. , Disp: , Rfl:  .  aspirin 325 MG tablet, Take 325 mg by mouth daily., Disp: , Rfl:  .  Calcium Carbonate-Vitamin D (CALCIUM 600 + D PO), Take 1 tablet by mouth daily. , Disp: , Rfl:  .  diazepam (VALIUM) 5 MG tablet, , Disp: , Rfl:  .  lisinopril (PRINIVIL,ZESTRIL) 10 MG tablet, Take 10 mg by mouth daily. , Disp: , Rfl:  .  LORazepam (ATIVAN) 0.5 MG tablet, Take 0.5-1 tablets by mouth at bedtime as needed., Disp: , Rfl:  .  meclizine (ANTIVERT) 25 MG tablet, Take 1 tablet by mouth 3 (three) times daily as needed., Disp: , Rfl: 0 .  Multiple Vitamin (MULTIVITAMIN) capsule, Take 1 capsule by mouth daily., Disp: , Rfl:  .  omeprazole (PRILOSEC) 20 MG capsule, Take 20 mg by mouth daily.  , Disp: , Rfl:  .  ondansetron (ZOFRAN) 4 MG tablet, Take 1 tablet (4 mg total) by mouth every 8 (eight) hours as needed for nausea or  vomiting., Disp: 11 tablet, Rfl: 0 .  simvastatin (ZOCOR) 20 MG tablet, Take 20 mg by mouth daily. , Disp: , Rfl:  .  vitamin E 400 UNIT capsule, Take 400 Units by mouth daily.  , Disp: , Rfl:   Social History   Tobacco Use  Smoking Status Former Smoker  . Types: Cigarettes, Pipe  . Last attempt to quit: 12/03/1962  . Years since quitting: 61.9  Smokeless Tobacco Former User  Tobacco Comment   quit 40+yrs ago    Allergies  Allergen Reactions  . Codeine    Objective:  There were no vitals filed for this visit. There is no height or weight on file to calculate BMI. Constitutional Well developed. Well nourished.  Vascular Dorsalis pedis pulses palpable bilaterally. Posterior tibial pulses palpable bilaterally. Capillary refill normal to all digits.  No cyanosis or clubbing noted. Pedal hair growth normal.  Neurologic Normal speech. Oriented to person, place, and time. Epicritic sensation to light touch grossly present bilaterally.  Dermatologic Nails elongated dystrophic pain to palpation Large leg open wound with fibroganular base No skin lesions.  Orthopedic: Normal joint ROM without pain or crepitus bilaterally. No visible deformities. No  bony tenderness.   Radiographs: None Assessment:   1. Open wound of left lower leg, initial encounter   2. Pain due to onychomycosis of nail    Plan:  Patient was evaluated and treated and all questions answered.  Onychomycosis with pain -Nails palliatively debridement as below -Educated on self-care  Procedure: Nail Debridement Rationale: Pain Type of Debridement: manual, sharp debridement. Instrumentation: Nail nipper, rotary burr. Number of Nails: 10  L Leg Wound s/p Debridement of Malignant Skin Lesion -Dressed today with dry sterile dressing. -Defer care to operating surgeon.  Return if symptoms worsen or fail to improve.

## 2018-01-21 DIAGNOSIS — C44629 Squamous cell carcinoma of skin of left upper limb, including shoulder: Secondary | ICD-10-CM | POA: Diagnosis not present

## 2018-01-21 DIAGNOSIS — D485 Neoplasm of uncertain behavior of skin: Secondary | ICD-10-CM | POA: Diagnosis not present

## 2018-04-16 DIAGNOSIS — H938X3 Other specified disorders of ear, bilateral: Secondary | ICD-10-CM | POA: Diagnosis not present

## 2018-04-16 DIAGNOSIS — H6123 Impacted cerumen, bilateral: Secondary | ICD-10-CM | POA: Diagnosis not present

## 2018-07-08 ENCOUNTER — Ambulatory Visit (INDEPENDENT_AMBULATORY_CARE_PROVIDER_SITE_OTHER): Payer: Medicare Other | Admitting: Podiatry

## 2018-07-08 ENCOUNTER — Encounter: Payer: Self-pay | Admitting: Podiatry

## 2018-07-08 ENCOUNTER — Other Ambulatory Visit: Payer: Self-pay

## 2018-07-08 VITALS — Temp 97.5°F

## 2018-07-08 DIAGNOSIS — Q828 Other specified congenital malformations of skin: Secondary | ICD-10-CM | POA: Diagnosis not present

## 2018-07-08 DIAGNOSIS — B351 Tinea unguium: Secondary | ICD-10-CM

## 2018-07-08 DIAGNOSIS — M79676 Pain in unspecified toe(s): Secondary | ICD-10-CM | POA: Diagnosis not present

## 2018-07-08 NOTE — Patient Instructions (Signed)

## 2018-07-12 NOTE — Progress Notes (Signed)
Subjective: Thomas Mccarty. presents today accompanied by his son on today. According to his son, he has painful lesion on the plantar aspect of his left foot. He also has elongated,  painful, thick toenails 1-5 b/l that he cannot cut and which interfere with daily activities.  Pain is aggravated when wearing enclosed shoe gear.  Per son, he also has multiple skin cancer lesion on both lower extremities. Doctors have discontinued any aggressive treatment due to the amount of lesions and his age. These will only be addressed if any acute changes occur.  [name] is wearing leather boat shoes which his son says he's had for over a dozen years or so. He does have Skechers at home, but his son says Dad always chooses these shoes.  Marda Stalker, PA-C is his PCP.    Current Outpatient Medications:  .  amLODipine (NORVASC) 2.5 MG tablet, Take 2.5 mg by mouth daily. , Disp: , Rfl:  .  aspirin 325 MG tablet, Take 325 mg by mouth daily., Disp: , Rfl:  .  Calcium Carbonate-Vitamin D (CALCIUM 600 + D PO), Take 1 tablet by mouth daily. , Disp: , Rfl:  .  diazepam (VALIUM) 5 MG tablet, , Disp: , Rfl:  .  lisinopril (PRINIVIL,ZESTRIL) 10 MG tablet, Take 10 mg by mouth daily. , Disp: , Rfl:  .  LORazepam (ATIVAN) 0.5 MG tablet, Take 0.5-1 tablets by mouth at bedtime as needed., Disp: , Rfl:  .  meclizine (ANTIVERT) 25 MG tablet, Take 1 tablet by mouth 3 (three) times daily as needed., Disp: , Rfl: 0 .  Multiple Vitamin (MULTIVITAMIN) capsule, Take 1 capsule by mouth daily., Disp: , Rfl:  .  omeprazole (PRILOSEC) 20 MG capsule, Take 20 mg by mouth daily.  , Disp: , Rfl:  .  ondansetron (ZOFRAN) 4 MG tablet, Take 1 tablet (4 mg total) by mouth every 8 (eight) hours as needed for nausea or vomiting., Disp: 11 tablet, Rfl: 0 .  simvastatin (ZOCOR) 20 MG tablet, Take 20 mg by mouth daily. , Disp: , Rfl:  .  vitamin E 400 UNIT capsule, Take 400 Units by mouth daily.  , Disp: , Rfl:   Allergies  Allergen  Reactions  . Codeine     Objective: Vitals:   07/08/18 1126  Temp: (!) 97.5 F (36.4 C)    Vascular Examination: Capillary refill time immediate x 10 digits.  Dorsalis pedis and Posterior tibial pulses palpable b/l.  Digital hair present x 10 digits.  Skin temperature gradient WNL b/l.  Dermatological Examination: Skin with normal turgor, texture and tone b/l.  Toenails 1-5 b/l discolored, thick, dystrophic with subungual debris and pain with palpation to nailbeds due to thickness of nails.  Multiple skin lesions noted geographically along both lower extremities.  Porokeratotic lesion submet head 1 left foot, elevated about 4 cm with tenderness to palpation. No erythema, no edema, no drainage, no flocculence.  Musculoskeletal: Muscle strength 5/5 to all LE muscle groups.  No gross bony deformities b/l.  No pain, crepitus or joint limitation noted with ROM.   Neurological: Sensation intact with 10 gram monofilament.  Vibratory sensation intact.  Assessment: Painful onychomycosis toenails 1-5 b/l  Painful porokeratotic lesion submet head 1 left foot Difficulty walking  Plan: 1. Toenails 1-5 b/l were debrided in length and girth without iatrogenic bleeding. 2. Unable to pare porokeratosis without local anesthetic. Son gave verbal permission for local injection. Area prepped with Betadine and 3 cc of a 1:1 mixture  of 1% lidocaine plain/0.5% marcaine plain infiltrated around lesion. I was then able to pare lesion without incident.  3. Advised son if we remove the choice of the boat shoes, Mr. Gonyer will wear his Skechers. Son will make arrangements at home to remove the old shoes.  4. Son/Patient to report any pedal injuries to medical professional immediately. 5. Follow up 10 weeks.   6. POA to call should there be a concern in the interim.

## 2018-09-15 ENCOUNTER — Ambulatory Visit: Payer: Medicare Other | Admitting: Podiatry

## 2019-06-22 ENCOUNTER — Institutional Professional Consult (permissible substitution): Payer: Medicare Other | Admitting: Plastic Surgery

## 2019-09-15 DIAGNOSIS — Z66 Do not resuscitate: Secondary | ICD-10-CM | POA: Diagnosis not present

## 2019-09-15 DIAGNOSIS — Z7189 Other specified counseling: Secondary | ICD-10-CM | POA: Diagnosis not present

## 2019-09-28 DIAGNOSIS — Z7189 Other specified counseling: Secondary | ICD-10-CM | POA: Diagnosis not present

## 2019-11-29 ENCOUNTER — Emergency Department (HOSPITAL_COMMUNITY): Payer: Medicare Other

## 2019-11-29 ENCOUNTER — Inpatient Hospital Stay (HOSPITAL_COMMUNITY)
Admission: EM | Admit: 2019-11-29 | Discharge: 2019-12-03 | DRG: 193 | Disposition: A | Discharge status: Hospice | Payer: Medicare Other | Attending: Internal Medicine | Admitting: Internal Medicine

## 2019-11-29 ENCOUNTER — Other Ambulatory Visit: Payer: Self-pay

## 2019-11-29 DIAGNOSIS — Z20822 Contact with and (suspected) exposure to covid-19: Secondary | ICD-10-CM | POA: Diagnosis present

## 2019-11-29 DIAGNOSIS — G9340 Encephalopathy, unspecified: Secondary | ICD-10-CM

## 2019-11-29 DIAGNOSIS — Z85820 Personal history of malignant melanoma of skin: Secondary | ICD-10-CM

## 2019-11-29 DIAGNOSIS — J449 Chronic obstructive pulmonary disease, unspecified: Secondary | ICD-10-CM | POA: Diagnosis not present

## 2019-11-29 DIAGNOSIS — N3289 Other specified disorders of bladder: Secondary | ICD-10-CM | POA: Diagnosis not present

## 2019-11-29 DIAGNOSIS — E869 Volume depletion, unspecified: Secondary | ICD-10-CM | POA: Diagnosis present

## 2019-11-29 DIAGNOSIS — R300 Dysuria: Secondary | ICD-10-CM | POA: Diagnosis not present

## 2019-11-29 DIAGNOSIS — R4182 Altered mental status, unspecified: Secondary | ICD-10-CM

## 2019-11-29 DIAGNOSIS — E7800 Pure hypercholesterolemia, unspecified: Secondary | ICD-10-CM | POA: Diagnosis present

## 2019-11-29 DIAGNOSIS — E872 Acidosis: Secondary | ICD-10-CM | POA: Diagnosis present

## 2019-11-29 DIAGNOSIS — C4359 Malignant melanoma of other part of trunk: Secondary | ICD-10-CM | POA: Diagnosis present

## 2019-11-29 DIAGNOSIS — F0281 Dementia in other diseases classified elsewhere with behavioral disturbance: Secondary | ICD-10-CM | POA: Diagnosis not present

## 2019-11-29 DIAGNOSIS — R338 Other retention of urine: Secondary | ICD-10-CM | POA: Diagnosis present

## 2019-11-29 DIAGNOSIS — R339 Retention of urine, unspecified: Secondary | ICD-10-CM

## 2019-11-29 DIAGNOSIS — T796XXA Traumatic ischemia of muscle, initial encounter: Secondary | ICD-10-CM | POA: Diagnosis not present

## 2019-11-29 DIAGNOSIS — I959 Hypotension, unspecified: Secondary | ICD-10-CM | POA: Diagnosis not present

## 2019-11-29 DIAGNOSIS — R0600 Dyspnea, unspecified: Secondary | ICD-10-CM

## 2019-11-29 DIAGNOSIS — E860 Dehydration: Secondary | ICD-10-CM | POA: Diagnosis present

## 2019-11-29 DIAGNOSIS — N401 Enlarged prostate with lower urinary tract symptoms: Secondary | ICD-10-CM | POA: Diagnosis present

## 2019-11-29 DIAGNOSIS — Z66 Do not resuscitate: Secondary | ICD-10-CM | POA: Diagnosis present

## 2019-11-29 DIAGNOSIS — M545 Low back pain, unspecified: Secondary | ICD-10-CM | POA: Diagnosis not present

## 2019-11-29 DIAGNOSIS — K219 Gastro-esophageal reflux disease without esophagitis: Secondary | ICD-10-CM | POA: Diagnosis present

## 2019-11-29 DIAGNOSIS — I10 Essential (primary) hypertension: Secondary | ICD-10-CM | POA: Diagnosis present

## 2019-11-29 DIAGNOSIS — I63231 Cerebral infarction due to unspecified occlusion or stenosis of right carotid arteries: Secondary | ICD-10-CM | POA: Diagnosis not present

## 2019-11-29 DIAGNOSIS — R079 Chest pain, unspecified: Secondary | ICD-10-CM | POA: Diagnosis not present

## 2019-11-29 DIAGNOSIS — I6521 Occlusion and stenosis of right carotid artery: Secondary | ICD-10-CM | POA: Diagnosis present

## 2019-11-29 DIAGNOSIS — Z515 Encounter for palliative care: Secondary | ICD-10-CM | POA: Diagnosis not present

## 2019-11-29 DIAGNOSIS — E876 Hypokalemia: Secondary | ICD-10-CM | POA: Diagnosis not present

## 2019-11-29 DIAGNOSIS — F03918 Unspecified dementia, unspecified severity, with other behavioral disturbance: Secondary | ICD-10-CM | POA: Diagnosis present

## 2019-11-29 DIAGNOSIS — I213 ST elevation (STEMI) myocardial infarction of unspecified site: Secondary | ICD-10-CM | POA: Diagnosis not present

## 2019-11-29 DIAGNOSIS — G548 Other nerve root and plexus disorders: Secondary | ICD-10-CM | POA: Diagnosis not present

## 2019-11-29 DIAGNOSIS — M6282 Rhabdomyolysis: Secondary | ICD-10-CM | POA: Diagnosis present

## 2019-11-29 DIAGNOSIS — R319 Hematuria, unspecified: Secondary | ICD-10-CM | POA: Diagnosis present

## 2019-11-29 DIAGNOSIS — Z043 Encounter for examination and observation following other accident: Secondary | ICD-10-CM | POA: Diagnosis not present

## 2019-11-29 DIAGNOSIS — F0391 Unspecified dementia with behavioral disturbance: Secondary | ICD-10-CM | POA: Diagnosis present

## 2019-11-29 DIAGNOSIS — Z9049 Acquired absence of other specified parts of digestive tract: Secondary | ICD-10-CM

## 2019-11-29 DIAGNOSIS — E785 Hyperlipidemia, unspecified: Secondary | ICD-10-CM | POA: Diagnosis present

## 2019-11-29 DIAGNOSIS — D4959 Neoplasm of unspecified behavior of other genitourinary organ: Secondary | ICD-10-CM | POA: Diagnosis present

## 2019-11-29 DIAGNOSIS — Z87891 Personal history of nicotine dependence: Secondary | ICD-10-CM

## 2019-11-29 DIAGNOSIS — K6289 Other specified diseases of anus and rectum: Secondary | ICD-10-CM | POA: Diagnosis not present

## 2019-11-29 DIAGNOSIS — Z681 Body mass index (BMI) 19 or less, adult: Secondary | ICD-10-CM | POA: Diagnosis not present

## 2019-11-29 DIAGNOSIS — R64 Cachexia: Secondary | ICD-10-CM | POA: Diagnosis present

## 2019-11-29 DIAGNOSIS — S3991XA Unspecified injury of abdomen, initial encounter: Secondary | ICD-10-CM | POA: Diagnosis not present

## 2019-11-29 DIAGNOSIS — C61 Malignant neoplasm of prostate: Secondary | ICD-10-CM | POA: Diagnosis present

## 2019-11-29 DIAGNOSIS — H353190 Nonexudative age-related macular degeneration, unspecified eye, stage unspecified: Secondary | ICD-10-CM | POA: Diagnosis present

## 2019-11-29 DIAGNOSIS — I491 Atrial premature depolarization: Secondary | ICD-10-CM | POA: Diagnosis not present

## 2019-11-29 DIAGNOSIS — J189 Pneumonia, unspecified organism: Secondary | ICD-10-CM | POA: Diagnosis present

## 2019-11-29 DIAGNOSIS — H3530 Unspecified macular degeneration: Secondary | ICD-10-CM | POA: Diagnosis not present

## 2019-11-29 DIAGNOSIS — G9341 Metabolic encephalopathy: Secondary | ICD-10-CM | POA: Diagnosis present

## 2019-11-29 DIAGNOSIS — M4800 Spinal stenosis, site unspecified: Secondary | ICD-10-CM | POA: Diagnosis not present

## 2019-11-29 DIAGNOSIS — I700 Atherosclerosis of aorta: Secondary | ICD-10-CM | POA: Diagnosis not present

## 2019-11-29 DIAGNOSIS — G311 Senile degeneration of brain, not elsewhere classified: Secondary | ICD-10-CM | POA: Diagnosis not present

## 2019-11-29 DIAGNOSIS — S299XXA Unspecified injury of thorax, initial encounter: Secondary | ICD-10-CM | POA: Diagnosis not present

## 2019-11-29 DIAGNOSIS — R109 Unspecified abdominal pain: Secondary | ICD-10-CM | POA: Diagnosis not present

## 2019-11-29 DIAGNOSIS — R5381 Other malaise: Secondary | ICD-10-CM | POA: Diagnosis not present

## 2019-11-29 DIAGNOSIS — Z85828 Personal history of other malignant neoplasm of skin: Secondary | ICD-10-CM | POA: Diagnosis not present

## 2019-11-29 LAB — URINALYSIS, ROUTINE W REFLEX MICROSCOPIC
Bilirubin Urine: NEGATIVE
Glucose, UA: NEGATIVE mg/dL
Hgb urine dipstick: NEGATIVE
Ketones, ur: 5 mg/dL — AB
Leukocytes,Ua: NEGATIVE
Nitrite: NEGATIVE
Protein, ur: NEGATIVE mg/dL
Specific Gravity, Urine: 1.015 (ref 1.005–1.030)
pH: 6 (ref 5.0–8.0)

## 2019-11-29 LAB — CBC
HCT: 39.3 % (ref 39.0–52.0)
Hemoglobin: 12.9 g/dL — ABNORMAL LOW (ref 13.0–17.0)
MCH: 30.1 pg (ref 26.0–34.0)
MCHC: 32.8 g/dL (ref 30.0–36.0)
MCV: 91.8 fL (ref 80.0–100.0)
Platelets: 239 10*3/uL (ref 150–400)
RBC: 4.28 MIL/uL (ref 4.22–5.81)
RDW: 13.4 % (ref 11.5–15.5)
WBC: 12.5 10*3/uL — ABNORMAL HIGH (ref 4.0–10.5)
nRBC: 0 % (ref 0.0–0.2)

## 2019-11-29 LAB — COMPREHENSIVE METABOLIC PANEL
ALT: 27 U/L (ref 0–44)
AST: 74 U/L — ABNORMAL HIGH (ref 15–41)
Albumin: 4.2 g/dL (ref 3.5–5.0)
Alkaline Phosphatase: 69 U/L (ref 38–126)
Anion gap: 13 (ref 5–15)
BUN: 21 mg/dL (ref 8–23)
CO2: 25 mmol/L (ref 22–32)
Calcium: 9.4 mg/dL (ref 8.9–10.3)
Chloride: 98 mmol/L (ref 98–111)
Creatinine, Ser: 0.89 mg/dL (ref 0.61–1.24)
GFR, Estimated: >60 mL/min (ref >60)
Glucose, Bld: 115 mg/dL — ABNORMAL HIGH (ref 70–99)
Potassium: 4.2 mmol/L (ref 3.5–5.1)
Sodium: 136 mmol/L (ref 135–145)
Total Bilirubin: 1.6 mg/dL — ABNORMAL HIGH (ref 0.3–1.2)
Total Protein: 7.7 g/dL (ref 6.5–8.1)

## 2019-11-29 LAB — RESP PANEL BY RT PCR (RSV, FLU A&B, COVID)
Influenza A by PCR: NEGATIVE
Influenza B by PCR: NEGATIVE
Respiratory Syncytial Virus by PCR: NEGATIVE
SARS Coronavirus 2 by RT PCR: NEGATIVE

## 2019-11-29 LAB — TROPONIN I (HIGH SENSITIVITY)
Troponin I (High Sensitivity): 27 ng/L — ABNORMAL HIGH (ref <18)
Troponin I (High Sensitivity): 21 ng/L — ABNORMAL HIGH (ref <18)

## 2019-11-29 LAB — LACTIC ACID, PLASMA: Lactic Acid, Venous: 2.4 mmol/L (ref 0.5–1.9)

## 2019-11-29 LAB — CK: Total CK: 2740 U/L — ABNORMAL HIGH (ref 49–397)

## 2019-11-29 LAB — CBG MONITORING, ED: Glucose-Capillary: 107 mg/dL — ABNORMAL HIGH (ref 70–99)

## 2019-11-29 MED ORDER — SODIUM CHLORIDE 0.9 % IV SOLN
2.0000 g | Freq: Once | INTRAVENOUS | Status: AC
  Administered 2019-11-29: 2 g via INTRAVENOUS
  Filled 2019-11-29: qty 20

## 2019-11-29 MED ORDER — ACETAMINOPHEN 325 MG PO TABS: 650.0000 mg | ORAL_TABLET | Freq: Four times a day (QID) | ORAL | Status: DC | PRN

## 2019-11-29 MED ORDER — ACETAMINOPHEN 650 MG RE SUPP: 650.0000 mg | Freq: Four times a day (QID) | RECTAL | Status: DC | PRN

## 2019-11-29 MED ORDER — SODIUM CHLORIDE 0.9 % IV SOLN: INTRAVENOUS | Status: DC

## 2019-11-29 MED ORDER — SODIUM CHLORIDE 0.9 % IV SOLN
500.0000 mg | INTRAVENOUS | Status: DC
  Administered 2019-11-29: 500 mg via INTRAVENOUS
  Filled 2019-11-29: qty 500

## 2019-11-29 MED ORDER — POLYETHYLENE GLYCOL 3350 17 G PO PACK: 17.0000 g | PACK | Freq: Every day | ORAL | Status: DC | PRN

## 2019-11-29 MED ORDER — ENOXAPARIN SODIUM 40 MG/0.4ML ~~LOC~~ SOLN
40.0000 mg | SUBCUTANEOUS | Status: DC
  Administered 2019-11-29 – 2019-12-01 (×3): 40 mg via SUBCUTANEOUS
  Filled 2019-11-29 (×3): qty 0.4

## 2019-11-29 MED ORDER — LACTATED RINGERS IV BOLUS
1000.0000 mL | Freq: Once | INTRAVENOUS | Status: AC
  Administered 2019-11-29: 1000 mL via INTRAVENOUS

## 2019-11-29 MED ORDER — SODIUM CHLORIDE 0.9 % IV BOLUS
1000.0000 mL | Freq: Once | INTRAVENOUS | Status: AC
  Administered 2019-11-29: 1000 mL via INTRAVENOUS

## 2019-11-29 MED ORDER — SODIUM CHLORIDE 0.9% FLUSH
3.0000 mL | Freq: Two times a day (BID) | INTRAVENOUS | Status: DC
  Administered 2019-11-30 – 2019-12-03 (×6): 3 mL via INTRAVENOUS

## 2019-11-29 MED ORDER — SODIUM CHLORIDE 0.9 % IV SOLN
2.0000 g | INTRAVENOUS | Status: DC
  Administered 2019-11-30 – 2019-12-02 (×3): 2 g via INTRAVENOUS
  Filled 2019-11-29: qty 2
  Filled 2019-11-29 (×2): qty 20
  Filled 2019-11-29: qty 2

## 2019-11-29 NOTE — ED Provider Notes (Signed)
[facility] Emergency Department Provider Note MRN:  [id number]  Arrival date & time: 11/29/19     Chief Complaint   Altered Mental Status   History of Present Illness   Thomas Gentle. is a 86 y.o. year-old male with a history of melanoma presenting to the ED with chief complaint of altered mental status.  Found down on the driveway by a neighbor.  Unknown downtime.  Vomited 3 times on scene.  Per daughter-in-law, has had a fairly rapid worsening in functional status and mental status over the past week.  Not eating properly, only eating ice cream.  Still living by himself.  I was unable to obtain an accurate HPI, PMH, or ROS due to the patient's altered mental status.  Level 5 caveat.  Review of Systems  Positive for fall, altered mental status.  Patient's Health History    Past Medical History:  Diagnosis Date  . Back pain   . Cancer ([place]) 10/01/11   Back - Melanoma  . Carotid stenosis    right  . Complication of anesthesia    has severe memory loss a week post op 8/13  . Dysuria   . Early cataracts, bilateral   . GERD (gastroesophageal reflux disease)    takes Omeprazole daily  . Hypercholesteremia    takes lovastatin daily  . Hypertension    takes Lisinopril daily  . Impaired hearing   . Keratosis   . Macular degeneration    dry  . Spinal stenosis   . Spinal stenosis     Past Surgical History:  Procedure Laterality Date  . APPENDECTOMY     as a child  . CAROTID ENDARTERECTOMY  03/19/11   RIGHT  cea  . CHOLECYSTECTOMY  approx. 1992  . ENDARTERECTOMY  03/19/2011   Procedure: ENDARTERECTOMY CAROTID;  Surgeon: Angelia Mould, MD;  Location: [facility] OR;  Service: Vascular;  Laterality: Right;  Right Carotid Endarterectomy with Primary Closure  . MELANOMA EXCISION Right 12/08/2012   Procedure: wide local excision right back MELANOMA EXCISION;  Surgeon: Stark Klein, MD;  Location: [facility];  Service: General;   Laterality: Right;  . MELANOMA EXCISION WITH SENTINEL LYMPH NODE BIOPSY  8/13   melanoma removed back with skin graft-snbx lt axillary    Family History  Problem Relation Age of Onset  . Ulcers Mother   . Anesthesia problems Neg Hx   . Hypotension Neg Hx   . Malignant hyperthermia Neg Hx   . Pseudochol deficiency Neg Hx     Social History   Socioeconomic History  . Marital status: Single    Spouse name: Not on file  . Number of children: Not on file  . Years of education: Not on file  . Highest education level: Not on file  Occupational History  . Not on file  Tobacco Use  . Smoking status: Former Smoker    Types: Cigarettes, Pipe    Quit date: 12/03/1962    Years since quitting: 57.0  . Smokeless tobacco: Former Systems developer  . Tobacco comment: quit 40+yrs ago  Substance and Sexual Activity  . Alcohol use: Yes    Alcohol/week: 1.0 standard drink    Types: 1 Glasses of wine per week    Comment: 1 glass of wine every night with evening mean  . Drug use: No  . Sexual activity: Never  Other Topics Concern  . Not on file  Social History Narrative  . Not on file   Social  Determinants of Health   Financial Resource Strain:   . Difficulty of Paying Living Expenses: Not on file  Food Insecurity:   . Worried About Charity fundraiser in the Last Year: Not on file  . Ran Out of Food in the Last Year: Not on file  Transportation Needs:   . Lack of Transportation (Medical): Not on file  . Lack of Transportation (Non-Medical): Not on file  Physical Activity:   . Days of Exercise per Week: Not on file  . Minutes of Exercise per Session: Not on file  Stress:   . Feeling of Stress : Not on file  Social Connections:   . Frequency of Communication with Friends and Family: Not on file  . Frequency of Social Gatherings with Friends and Family: Not on file  . Attends Religious Services: Not on file  . Active Member of Clubs or Organizations: Not on file  . Attends Archivist  Meetings: Not on file  . Marital Status: Not on file  Intimate Partner Violence:   . Fear of Current or Ex-Partner: Not on file  . Emotionally Abused: Not on file  . Physically Abused: Not on file  . Sexually Abused: Not on file     Physical Exam   Vitals:   11/29/19 1730 11/29/19 1745  BP: (!) 125/99 (!) 153/80  Pulse: 72 69  Resp: 18 14  Temp:    SpO2: 98% 97%    CONSTITUTIONAL: Chronically ill-appearing, NAD NEURO: Somnolent, oriented to name, moves all extremities EYES:  eyes equal and reactive ENT/NECK:  no LAD, no JVD CARDIO: Regular rate, well-perfused, normal S1 and S2 PULM:  CTAB no wheezing or rhonchi GI/GU:  normal bowel sounds, prominent abdominal distention and rigidness, tender to palpation MSK/SPINE:  No gross deformities, no edema SKIN: Diffuse seborrheic dermatitis to the legs, scalp PSYCH:  Appropriate speech and behavior  *Additional and/or pertinent findings included in MDM below  Diagnostic and Interventional Summary    EKG Interpretation  Date/Time:  Monday November 29 2019 17:58:36 EDT Ventricular Rate:  127 PR Interval:    QRS Duration: 169 QT Interval:  393 QTC Calculation: 500 R Axis:   88 Text Interpretation: Atrial fibrillation Paired ventricular premature complexes LVH with secondary repolarization abnormality ST depression, consider ischemia, diffuse lds Borderline prolonged QT interval Artifact in lead(s) I aVF V1 V2 V4 V5 V6 No significant change was found Confirmed by Gerlene Fee [phone]) on 11/29/2019 6:06:04 PM      Labs Reviewed  COMPREHENSIVE METABOLIC PANEL - Abnormal; Notable for the following components:      Result Value   Glucose, Bld 115 (*)    AST 74 (*)    Total Bilirubin 1.6 (*)    All other components within normal limits  CBC - Abnormal; Notable for the following components:   WBC 12.5 (*)    Hemoglobin 12.9 (*)    All other components within normal limits  URINALYSIS, ROUTINE W REFLEX MICROSCOPIC - Abnormal;  Notable for the following components:   Ketones, ur 5 (*)    All other components within normal limits  LACTIC ACID, PLASMA - Abnormal; Notable for the following components:   Lactic Acid, Venous 2.4 (*)    All other components within normal limits  CK - Abnormal; Notable for the following components:   Total CK 2,740 (*)    All other components within normal limits  CBG MONITORING, ED - Abnormal; Notable for the following components:   Glucose-Capillary 107 (*)  All other components within normal limits  TROPONIN I (HIGH SENSITIVITY) - Abnormal; Notable for the following components:   Troponin I (High Sensitivity) 27 (*)    All other components within normal limits  RESP PANEL BY RT PCR (RSV, FLU A&B, COVID)  URINE CULTURE  TROPONIN I (HIGH SENSITIVITY)  TROPONIN I (HIGH SENSITIVITY)    CT CERVICAL SPINE WO CONTRAST  Final Result    CT Chest Wo Contrast  Final Result    CT ABDOMEN PELVIS WO CONTRAST  Final Result    DG Chest Port 1 View  Final Result    CT HEAD WO CONTRAST    (Results Pending)    Medications  sodium chloride 0.9 % bolus 1,000 mL (0 mLs Intravenous Stopped 11/29/19 1729)  cefTRIAXone (ROCEPHIN) 2 g in sodium chloride 0.9 % 100 mL IVPB (0 g Intravenous Stopped 11/29/19 1646)  lactated ringers bolus 1,000 mL (1,000 mLs Intravenous New Bag/Given 11/29/19 1732)     Procedures  /  Critical Care Procedures  ED Course and Medical Decision Making  I have reviewed the triage vital signs, the nursing notes, and pertinent available records from the EMR.  Listed above are laboratory and imaging tests that I personally ordered, reviewed, and interpreted and then considered in my medical decision making (see below for details).  Considering metabolic disarray, UTI, rhabdomyolysis, underlying malignancy, intra-abdominal perforation given the rigidity on exam.  Labs and CT imaging pending.     Work-up reveals rhabdomyolysis, mildly elevated troponin.  Will admit  to medicine for further care.  Barth Kirks. Sedonia Small, MD [place] [email or web address]  Final Clinical Impressions(s) / ED Diagnoses     ICD-10-CM   1. Dehydration  E86.0   2. Non-traumatic rhabdomyolysis  M62.82   3. Urinary retention  R33.9   4. Altered mental status, unspecified altered mental status type  R41.82     ED Discharge Orders    None       Discharge Instructions Discussed with and Provided to Patient:   Discharge Instructions   None       Maudie Flakes, MD 11/29/19 1806

## 2019-11-29 NOTE — ED Triage Notes (Signed)
Per EMS patient was found down at home by neighbor in his driveway. Unknown down time. Patient found curled up and contracted by EMS and they also stated that he vomited three times on the scene and the neighbor stated he vomited once before they arrived. Patient was alert and oriented times 1 for EMS. Per son patient has no medical history and takes no medication at home. Patient presents with small skin breakdown at the top of buttocks upon arrival.

## 2019-11-29 NOTE — ED Notes (Signed)
Pt transported to CT ?

## 2019-11-29 NOTE — ED Notes (Signed)
Unable to obtain clear EKG d/t artifact. Dr. Sedonia Small informed of delay. EKG obtained at this time.

## 2019-11-29 NOTE — H&P (Signed)
History and Physical   Thomas Mccarty. YWV:[id number] DOB: [birth year] DOA: 11/29/2019  PCP: Thomas Stalker, PA-C   Patient coming from: Home  Chief Complaint: Encephalopathy, fall, vomiting  HPI: Thomas Mccarty. is a 86 y.o. male with medical history significant of carotid stenosis, cataracts, GERD, hypertension, spinal stenosis who presents after being found down at home.  Patient is unable to provide significant history and history obtained with assistance of chart review and family at bedside.  Per family he has had progressive dementia-like symptoms for the past three years.  They state he has good and bad days, but is able to feed himself, get ready for bed and sleep, and clean his home.  He has had some changes to his diet for the past week, he has been eating a lot of ice cream.  He has a history of chronic diffuse skin changes that were previously frequently excised by dermatology, but they stopped doing this a long time ago as this was causing him more discomfort than benefit.  He was found down at home by a neighbor today.  EMS was contacted and on their arrival patient was noted to be contracted on the floor and had vomited several times while they were there.  At that time he was alert and oriented to self only.  He is reportedly typically oriented x3 per family.  ED Course: Vital signs stable in the ED.  CMP showed only mild AST elevation at 74 and T bili at 1.6 and these changes may be due to dehydration and vomiting.  CBC showed hemoglobin stable at 12.9 and leukocytosis of 12.5.  CK was elevated at 2740, initial troponin was 27 with second pending.  Lactic acid mildly elevated at 2.4, respiratory panel for flu and Covid was negative.  UA did not show signs of UTI.  And received two 1 L boluses and a dose of ceftriaxone in the ED.  Review of Systems: Unable to conduct full HPI as patient is encephalopathic and family was not with him at the time of onset of symptoms.  Past  Medical History:  Diagnosis Date  . Back pain   . Cancer ([place]) 10/01/11   Back - Melanoma  . Carotid stenosis    right  . Complication of anesthesia    has severe memory loss a week post op 8/13  . Dysuria   . Early cataracts, bilateral   . GERD (gastroesophageal reflux disease)    takes Omeprazole daily  . Hypercholesteremia    takes lovastatin daily  . Hypertension    takes Lisinopril daily  . Impaired hearing   . Keratosis   . Macular degeneration    dry  . Spinal stenosis   . Spinal stenosis     Past Surgical History:  Procedure Laterality Date  . APPENDECTOMY     as a child  . CAROTID ENDARTERECTOMY  03/19/11   RIGHT  cea  . CHOLECYSTECTOMY  approx. 1992  . ENDARTERECTOMY  03/19/2011   Procedure: ENDARTERECTOMY CAROTID;  Surgeon: Thomas Mould, MD;  Location: [facility];  Service: Vascular;  Laterality: Right;  Right Carotid Endarterectomy with Primary Closure  . MELANOMA EXCISION Right 12/08/2012   Procedure: wide local excision right back MELANOMA EXCISION;  Surgeon: Thomas Klein, MD;  Location: [facility];  Service: General;  Laterality: Right;  . MELANOMA EXCISION WITH SENTINEL LYMPH NODE BIOPSY  8/13   melanoma removed back with skin graft-snbx lt axillary    Social  History  reports that he quit smoking about 57 years ago. His smoking use included cigarettes and pipe. He has quit using smokeless tobacco. He reports current alcohol use of about 1.0 standard drink of alcohol per week. He reports that he does not use drugs.  Allergies  Allergen Reactions  . Codeine     Family History  Problem Relation Age of Onset  . Ulcers Mother   . Anesthesia problems Neg Hx   . Hypotension Neg Hx   . Malignant hyperthermia Neg Hx   . Pseudochol deficiency Neg Hx   Reviewed on admission  Prior to Admission medications   Medication Sig Start Date End Date Taking? Authorizing Provider  ondansetron (ZOFRAN) 4 MG tablet Take 1 tablet (4 mg total) by mouth  every 8 (eight) hours as needed for nausea or vomiting. Patient not taking: Reported on 11/29/2019 01/07/15   Thomas Critchley, MD    Physical Exam: Vitals:   11/29/19 1900 11/29/19 1915 11/29/19 1930 11/29/19 1945  BP: 117/61 118/73 101/61 123/66  Pulse: 69 70 68 63  Resp: 14 13 14 14   Temp:      TempSrc:      SpO2: 97% 99% 98% 97%  Weight:      Height:       Physical Exam Constitutional:      General: He is not in acute distress.    Appearance: Normal appearance.     Comments: Elderly, thin, ill-appearing male  HENT:     Head: Normocephalic and atraumatic.     Mouth/Throat:     Mouth: Mucous membranes are moist.     Pharynx: Oropharynx is clear.  Eyes:     Extraocular Movements: Extraocular movements intact.     Pupils: Pupils are equal, round, and reactive to light.  Cardiovascular:     Rate and Rhythm: Normal rate and regular rhythm.     Pulses: Normal pulses.     Heart sounds: Normal heart sounds.     Comments: Distant heart sounds Pulmonary:     Effort: Pulmonary effort is normal. No respiratory distress.     Breath sounds: Normal breath sounds.  Abdominal:     General: Bowel sounds are normal. There is no distension.     Palpations: Abdomen is soft.     Tenderness: There is no abdominal tenderness.  Musculoskeletal:        General: No swelling or deformity.  Skin:    General: Skin is warm and dry.     Comments: Diffuse chronic gowths  Neurological:     General: No focal deficit present.     Mental Status: Mental status is at baseline.    Labs on Admission: I have personally reviewed following labs and imaging studies  CBC: Recent Labs  Lab 11/29/19 1510  WBC 12.5*  HGB 12.9*  HCT 39.3  MCV 91.8  PLT 700    Basic Metabolic Panel: Recent Labs  Lab 11/29/19 1510  NA 136  K 4.2  CL 98  CO2 25  GLUCOSE 115*  BUN 21  CREATININE 0.89  CALCIUM 9.4    GFR: Estimated Creatinine Clearance: 49.3 mL/min (by C-G formula based on SCr of 0.89  mg/dL).  Liver Function Tests: Recent Labs  Lab 11/29/19 1510  AST 74*  ALT 27  ALKPHOS 69  BILITOT 1.6*  PROT 7.7  ALBUMIN 4.2    Urine analysis:    Component Value Date/Time   COLORURINE YELLOW 11/29/2019 [place] 11/29/2019 1615  LABSPEC 1.015 11/29/2019 1615   PHURINE 6.0 11/29/2019 1615   GLUCOSEU NEGATIVE 11/29/2019 1615   HGBUR NEGATIVE 11/29/2019 [place] 11/29/2019 1615   KETONESUR 5 (A) 11/29/2019 1615   PROTEINUR NEGATIVE 11/29/2019 1615   UROBILINOGEN 0.2 03/18/2011 1149   NITRITE NEGATIVE 11/29/2019 [place] 11/29/2019 1615    Radiological Exams on Admission: CT ABDOMEN PELVIS WO CONTRAST  Result Date: 11/29/2019 CLINICAL DATA:  Acute pain due to trauma. EXAM: CT CHEST, ABDOMEN AND PELVIS WITHOUT CONTRAST TECHNIQUE: Multidetector CT imaging of the chest, abdomen and pelvis was performed following the standard protocol without IV contrast. COMPARISON:  None. FINDINGS: CT CHEST FINDINGS Cardiovascular: The heart size is normal. There are coronary artery calcifications. There are atherosclerotic changes of the thoracic aorta. There is no significant pericardial effusion. Mediastinum/Nodes: -- No mediastinal lymphadenopathy. -- No hilar lymphadenopathy. -- No axillary lymphadenopathy. -- No supraclavicular lymphadenopathy. -- Normal thyroid gland where visualized. -  Unremarkable esophagus. Lungs/Pleura: There are few tree-in-bud airspace opacities in the right upper lobe. There is a focal area of consolidation involving the right lower lobe measuring approximately 1 cm (axial series 4, image 102). Small nodules are noted in the left lower lobe and lingula. There is no pneumothorax. No large pleural effusion. There is debris within the trachea and right mainstem bronchus. Musculoskeletal: No chest wall abnormality. No bony spinal canal stenosis. CT ABDOMEN PELVIS FINDINGS Hepatobiliary: The liver is normal. Status  post cholecystectomy.There is no biliary ductal dilation. Pancreas: Normal contours without ductal dilatation. No peripancreatic fluid collection. Spleen: Unremarkable. Adrenals/Urinary Tract: --Adrenal glands: Unremarkable. --Right kidney/ureter: No hydronephrosis or radiopaque kidney stones. --Left kidney/ureter: No hydronephrosis or radiopaque kidney stones. --Urinary bladder: Bladder is decompressed by Foley catheter Stomach/Bowel: --Stomach/Duodenum: No hiatal hernia or other gastric abnormality. Normal duodenal course and caliber. --Small bowel: Unremarkable. --Colon: There is a large amount of stool in the rectum. --Appendix: Not visualized. No right lower quadrant inflammation or free fluid. Vascular/Lymphatic: Atherosclerotic calcification is present within the non-aneurysmal abdominal aorta, without hemodynamically significant stenosis. --No retroperitoneal lymphadenopathy. --No mesenteric lymphadenopathy. --No pelvic or inguinal lymphadenopathy. Reproductive: Unremarkable Other: No ascites or free air. The abdominal wall is normal. Musculoskeletal. No acute displaced fractures. IMPRESSION: 1. No acute traumatic injury to the chest, abdomen or pelvis. 2. Tree-in-bud airspace opacities bilaterally with a small focus of consolidation in the right lower lobe. These are favored to be infectious or inflammatory in etiology. However, a three-month follow-up CT is recommended to confirm stability or resolution. 3. Large amount of stool in the rectum. Aortic Atherosclerosis (ICD10-I70.0). Electronically Signed   By: Constance Holster M.D.   On: 11/29/2019 17:29   CT HEAD WO CONTRAST  Result Date: 11/29/2019 CLINICAL DATA:  Altered mental status, found down in his driveway. EXAM: CT HEAD WITHOUT CONTRAST TECHNIQUE: Contiguous axial images were obtained from the base of the skull through the vertex without intravenous contrast. COMPARISON:  January 25, 2011 FINDINGS: Brain: There is mild cerebral atrophy with  widening of the extra-axial spaces and ventricular dilatation. There are areas of decreased attenuation within the white matter tracts of the supratentorial brain, consistent with microvascular disease changes. Vascular: No hyperdense vessel or unexpected calcification. Skull: Normal. Negative for fracture or focal lesion. Sinuses/Orbits: No acute finding. Other: Mild scalp soft tissue swelling is seen along the posterior aspect of the vertex on the left, with an associated 2.6 cm x 0.7 cm scalp soft tissue mass. IMPRESSION: 1. Mild scalp soft tissue swelling, and  associated soft tissue mass, along the posterior aspect of the vertex on the left. Correlation with physical examination and follow-up MRI is recommended to exclude an underlying neoplasm. 2. No acute intracranial abnormality. Electronically Signed   By: Virgina Norfolk M.D.   On: 11/29/2019 18:57   CT Chest Wo Contrast  Result Date: 11/29/2019 CLINICAL DATA:  Acute pain due to trauma. EXAM: CT CHEST, ABDOMEN AND PELVIS WITHOUT CONTRAST TECHNIQUE: Multidetector CT imaging of the chest, abdomen and pelvis was performed following the standard protocol without IV contrast. COMPARISON:  None. FINDINGS: CT CHEST FINDINGS Cardiovascular: The heart size is normal. There are coronary artery calcifications. There are atherosclerotic changes of the thoracic aorta. There is no significant pericardial effusion. Mediastinum/Nodes: -- No mediastinal lymphadenopathy. -- No hilar lymphadenopathy. -- No axillary lymphadenopathy. -- No supraclavicular lymphadenopathy. -- Normal thyroid gland where visualized. -  Unremarkable esophagus. Lungs/Pleura: There are few tree-in-bud airspace opacities in the right upper lobe. There is a focal area of consolidation involving the right lower lobe measuring approximately 1 cm (axial series 4, image 102). Small nodules are noted in the left lower lobe and lingula. There is no pneumothorax. No large pleural effusion. There is  debris within the trachea and right mainstem bronchus. Musculoskeletal: No chest wall abnormality. No bony spinal canal stenosis. CT ABDOMEN PELVIS FINDINGS Hepatobiliary: The liver is normal. Status post cholecystectomy.There is no biliary ductal dilation. Pancreas: Normal contours without ductal dilatation. No peripancreatic fluid collection. Spleen: Unremarkable. Adrenals/Urinary Tract: --Adrenal glands: Unremarkable. --Right kidney/ureter: No hydronephrosis or radiopaque kidney stones. --Left kidney/ureter: No hydronephrosis or radiopaque kidney stones. --Urinary bladder: Bladder is decompressed by Foley catheter Stomach/Bowel: --Stomach/Duodenum: No hiatal hernia or other gastric abnormality. Normal duodenal course and caliber. --Small bowel: Unremarkable. --Colon: There is a large amount of stool in the rectum. --Appendix: Not visualized. No right lower quadrant inflammation or free fluid. Vascular/Lymphatic: Atherosclerotic calcification is present within the non-aneurysmal abdominal aorta, without hemodynamically significant stenosis. --No retroperitoneal lymphadenopathy. --No mesenteric lymphadenopathy. --No pelvic or inguinal lymphadenopathy. Reproductive: Unremarkable Other: No ascites or free air. The abdominal wall is normal. Musculoskeletal. No acute displaced fractures. IMPRESSION: 1. No acute traumatic injury to the chest, abdomen or pelvis. 2. Tree-in-bud airspace opacities bilaterally with a small focus of consolidation in the right lower lobe. These are favored to be infectious or inflammatory in etiology. However, a three-month follow-up CT is recommended to confirm stability or resolution. 3. Large amount of stool in the rectum. Aortic Atherosclerosis (ICD10-I70.0). Electronically Signed   By: Constance Holster M.D.   On: 11/29/2019 17:29   CT CERVICAL SPINE WO CONTRAST  Result Date: 11/29/2019 CLINICAL DATA:  Status post fall. EXAM: CT CERVICAL SPINE WITHOUT CONTRAST TECHNIQUE:  Multidetector CT imaging of the cervical spine was performed without intravenous contrast. Multiplanar CT image reconstructions were also generated. COMPARISON:  January 07, 2015 FINDINGS: Alignment: Approximately 2 mm to 3 mm retrolisthesis of the C2 vertebral body is noted on C3. Skull base and vertebrae: No acute fracture. No primary bone lesion or focal pathologic process. Soft tissues and spinal canal: No prevertebral fluid or swelling. No visible canal hematoma. Disc levels: Moderate severity multilevel endplate sclerosis and anterior osteophyte formation is seen throughout the cervical spine with calcification of the anterior longitudinal ligament. There is marked severity narrowing of the anterior atlantoaxial articulation. Moderate to marked severity multilevel intervertebral disc space narrowing is seen. Marked severity bilateral multilevel facet joint hypertrophy is noted. Upper chest: Negative. Other: None. IMPRESSION: 1. No acute fracture within  the cervical spine. 2. Marked severity multilevel degenerative changes. 3. Approximately 2 mm to 3 mm retrolisthesis of the C2 vertebral body on C3. Electronically Signed   By: Virgina Norfolk M.D.   On: 11/29/2019 17:22   DG Chest Port 1 View  Result Date: 11/29/2019 CLINICAL DATA:  Altered mental status EXAM: PORTABLE CHEST 1 VIEW COMPARISON:  Radiograph 11/13/2015 FINDINGS: Chronic hyperinflation with coarsened interstitial changes and likely scarring in the right mid lung. No new consolidative opacity is seen. No pneumothorax or visible effusion. The aorta is calcified. The remaining cardiomediastinal contours are unremarkable. No acute osseous or soft tissue abnormality. Degenerative changes are present in the imaged spine and shoulders. Surgical clips noted in the left axilla. Telemetry leads overlie the chest. IMPRESSION: No acute cardiopulmonary abnormality. Chronic hyperinflation and coarsened interstitial changes. Aortic Atherosclerosis  (ICD10-I70.0). Electronically Signed   By: Lovena Le M.D.   On: 11/29/2019 15:53    EKG: Independently reviewed.  Tachycardic, diffuse artifact likely due to tremor.  Possible A. fib, but RR intervals are regular favoring artifact.  Assessment/Plan Principal Problem:   Acute encephalopathy Active Problems:   CAP (community acquired pneumonia)  Pneumonia Acute encephalopathy > Decreased p.o. intake, nausea and vomiting, possible pneumonia. > Received 2 L of IV fluids and a dose of ceftriaxone in the ED > Right lower lobe infiltrate noted on chest x-ray with tree-in-bud appearance. > Given acute encephalopathy in the setting of dementia and elevated WBC, will treat for CAP - Continue maintenance fluids - Add azithromycin to ceftriaxone for CAP coverage - Repeat chest x-ray in the morning after having received IV fluids  - Monitor CBC  Nontraumatic rhabdomyolysis > Found down at home, CK moderately elevated to 2740 > Has received 2 L in the ED, will continue maintenance fluids - Recheck CK to ensure downtrending  Fall? Dehydration  > Found down at home as above.  Unclear if hefell down but does have skin tears > Dehydration in setting of dietary changes possible, however renal function is stable - PT/OT eval and treat - Nutrition consult - Check orthostatics - Telemetry overnight  Skin lesions, history of squamous cell carcinoma > Followed by dermatology, previously with multiple excisions, but this was stopped due to more discomfort than benefit  Urinary obstruction? > Reportedly had 2L out with foley placement, but no AKI  DVT prophylaxis: Lovenox  Code Status:   DNR, no aggressive work-up for skin lesions, full scope of treatment Family Communication:  Son, Jenny Reichmann, updated by phone at [phone] Disposition Plan:   Patient is from:  Home  Anticipated DC to:  Pending work-up  Anticipated DC date:  Pending response to therapy  Anticipated DC barriers: Dementia, lives  alone  Consults called:  None  Admission status:  Inpatient, telemetry   Severity of Illness: Severity of Illness: The appropriate patient status for this patient is INPATIENT. Inpatient status is judged to be reasonable and necessary in order to provide the required intensity of service to ensure the patient's safety. The patient's presenting symptoms, physical exam findings, and initial radiographic and laboratory data in the context of their chronic comorbidities is felt to place them at high risk for further clinical deterioration. Furthermore, it is not anticipated that the patient will be medically stable for discharge from the hospital within 2 midnights of admission. The following factors support the patient status of inpatient.   " The patient's presenting symptoms include encephalopathy. " The worrisome physical exam findings include encephalopathy, right lower lobe infiltrate. "  The initial radiographic and laboratory data are worrisome because of elevated WBC, right lower lobe infiltrate. " The chronic co-morbidities include carotid artery disease.   * I certify that at the point of admission it is my clinical judgment that the patient will require inpatient hospital care spanning beyond 2 midnights from the point of admission due to high intensity of service, high risk for further deterioration and high frequency of surveillance required.Marcelyn Bruins MD [facility]  How to contact the [facility] Attending or Consulting provider [place] or covering provider during after hours [place], for this patient?   1. Check the care team in [facility] and look for a) attending/consulting [facility] provider listed and b) the [facility] team listed 2. Log into [email or web address] and use [place]'s universal password to access. If you do not have the password, please contact the hospital operator. 3. Locate the [facility] provider you are looking for under [facility] and page to a number that you can be  directly reached. 4. If you still have difficulty reaching the provider, please page the [facility] (Director on Call) for the Hospitalists listed on amion for assistance.  11/29/2019, 8:31 PM

## 2019-11-30 ENCOUNTER — Encounter (HOSPITAL_COMMUNITY): Payer: Self-pay | Admitting: Internal Medicine

## 2019-11-30 DIAGNOSIS — G9340 Encephalopathy, unspecified: Secondary | ICD-10-CM | POA: Diagnosis not present

## 2019-11-30 DIAGNOSIS — F0391 Unspecified dementia with behavioral disturbance: Secondary | ICD-10-CM

## 2019-11-30 DIAGNOSIS — T796XXA Traumatic ischemia of muscle, initial encounter: Secondary | ICD-10-CM

## 2019-11-30 DIAGNOSIS — J189 Pneumonia, unspecified organism: Principal | ICD-10-CM

## 2019-11-30 DIAGNOSIS — M6282 Rhabdomyolysis: Secondary | ICD-10-CM | POA: Diagnosis present

## 2019-11-30 DIAGNOSIS — F03918 Unspecified dementia, unspecified severity, with other behavioral disturbance: Secondary | ICD-10-CM | POA: Diagnosis present

## 2019-11-30 DIAGNOSIS — K219 Gastro-esophageal reflux disease without esophagitis: Secondary | ICD-10-CM | POA: Diagnosis not present

## 2019-11-30 LAB — CBC
HCT: 36.1 % — ABNORMAL LOW (ref 39.0–52.0)
HCT: 34.7 % — ABNORMAL LOW (ref 39.0–52.0)
Hemoglobin: 11.6 g/dL — ABNORMAL LOW (ref 13.0–17.0)
Hemoglobin: 11.4 g/dL — ABNORMAL LOW (ref 13.0–17.0)
MCH: 29.7 pg (ref 26.0–34.0)
MCH: 30.4 pg (ref 26.0–34.0)
MCHC: 32.1 g/dL (ref 30.0–36.0)
MCHC: 32.9 g/dL (ref 30.0–36.0)
MCV: 92.6 fL (ref 80.0–100.0)
MCV: 92.5 fL (ref 80.0–100.0)
Platelets: 210 10*3/uL (ref 150–400)
Platelets: 200 10*3/uL (ref 150–400)
RBC: 3.9 MIL/uL — ABNORMAL LOW (ref 4.22–5.81)
RBC: 3.75 MIL/uL — ABNORMAL LOW (ref 4.22–5.81)
RDW: 13.5 % (ref 11.5–15.5)
RDW: 13.5 % (ref 11.5–15.5)
WBC: 13.1 10*3/uL — ABNORMAL HIGH (ref 4.0–10.5)
WBC: 12.4 10*3/uL — ABNORMAL HIGH (ref 4.0–10.5)
nRBC: 0 % (ref 0.0–0.2)
nRBC: 0 % (ref 0.0–0.2)

## 2019-11-30 LAB — COMPREHENSIVE METABOLIC PANEL
ALT: 30 U/L (ref 0–44)
AST: 94 U/L — ABNORMAL HIGH (ref 15–41)
Albumin: 3.5 g/dL (ref 3.5–5.0)
Alkaline Phosphatase: 60 U/L (ref 38–126)
Anion gap: 11 (ref 5–15)
BUN: 22 mg/dL (ref 8–23)
CO2: 24 mmol/L (ref 22–32)
Calcium: 8.8 mg/dL — ABNORMAL LOW (ref 8.9–10.3)
Chloride: 102 mmol/L (ref 98–111)
Creatinine, Ser: 0.91 mg/dL (ref 0.61–1.24)
GFR, Estimated: >60 mL/min (ref >60)
Glucose, Bld: 95 mg/dL (ref 70–99)
Potassium: 3.7 mmol/L (ref 3.5–5.1)
Sodium: 137 mmol/L (ref 135–145)
Total Bilirubin: 1.1 mg/dL (ref 0.3–1.2)
Total Protein: 6.4 g/dL — ABNORMAL LOW (ref 6.5–8.1)

## 2019-11-30 LAB — LACTIC ACID, PLASMA: Lactic Acid, Venous: 0.9 mmol/L (ref 0.5–1.9)

## 2019-11-30 LAB — URINE CULTURE: Culture: NO GROWTH

## 2019-11-30 LAB — PROCALCITONIN
Procalcitonin: 0.27 ng/mL
Procalcitonin: 0.27 ng/mL

## 2019-11-30 LAB — CK: Total CK: 2419 U/L — ABNORMAL HIGH (ref 49–397)

## 2019-11-30 MED ORDER — LORAZEPAM 2 MG/ML IJ SOLN
1.0000 mg | INTRAMUSCULAR | Status: DC | PRN
  Administered 2019-11-30 – 2019-12-03 (×7): 1 mg via INTRAVENOUS
  Filled 2019-11-30 (×6): qty 1

## 2019-11-30 MED ORDER — CHLORHEXIDINE GLUCONATE CLOTH 2 % EX PADS
6.0000 | MEDICATED_PAD | Freq: Every day | CUTANEOUS | Status: DC
  Administered 2019-11-30 – 2019-12-03 (×4): 6 via TOPICAL

## 2019-11-30 MED ORDER — SODIUM CHLORIDE 0.9 % IV SOLN
100.0000 mg | Freq: Two times a day (BID) | INTRAVENOUS | Status: DC
  Administered 2019-11-30 – 2019-12-03 (×7): 100 mg via INTRAVENOUS
  Filled 2019-11-30 (×8): qty 100

## 2019-11-30 NOTE — ED Notes (Signed)
Pt continuously pulling at foley catheter, pt redirected numerous times.

## 2019-11-30 NOTE — ED Notes (Signed)
Attempted to call report, per nurse bed assignment is being changed

## 2019-11-30 NOTE — ED Notes (Signed)
Lunch Tray Ordered @ 1014.

## 2019-11-30 NOTE — ED Notes (Signed)
Pt leaving ED at this time as tele was ordered

## 2019-11-30 NOTE — ED Notes (Signed)
Removed bedpan and no BM at this time. Son is at bedside and pt readjusted in bed

## 2019-11-30 NOTE — ED Notes (Signed)
Pt becoming agitated and pulling at all lines and tubes. Dr Laurel Dimmer aware and new ordered received

## 2019-11-30 NOTE — Progress Notes (Addendum)
PROGRESS NOTE  Thomas Mccarty. STM:[id number] DOB: [birth year] DOA: 11/29/2019 PCP: Marda Stalker, PA-C  HPI/Recap of past 24 hours: Patient is a 86 year old male with past medical history of GERD, hypertension and likely dementia who lives alone with family close by but has been relatively independent and was brought into the emergency room after being found down by a neighbor on the afternoon of 10/25.  According to family, he has had dementia that is been slowly progressing over the past few years, but has stubbornly maintained his independence.  In the emergency room, patient was found to have a leukocytosis of 12.5, CK level of 2740 lactic acid level of 2.4 and chest x-ray concerning for possible pneumonia.  Although he was found down, he was confused but awake in the emergency room.  He was admitted to the hospitalist service for rhabdomyolysis and treatment of pneumonia.    This morning, procalcitonin level elevated at 0.24.  White blood cell count slightly increased to 13.  Patient still confused but awake.  By afternoon, more agitated requiring doses of Ativan.  Patient himself unable to communicate with me, although he is awake.  Assessment/Plan: Active Problems:   HTN (hypertension): Resuming his home medications.    GERD (gastroesophageal reflux disease): Continue PPI.    Hyperlipidemia   CAP (community acquired pneumonia): Sepsis ruled out.  Patient only had an elevated white blood cell count, but no SIRS criteria on admission.  Community-acquired pneumonia: Confirmed with elevated procalcitonin level.  Continue antibiotics although noting white count slightly increased today.  Repeat CBC now and if it warm follow-up procalcitonin level in the morning elevated, change antibiotics.  No signs of aspiration, although this could have certainly occurred.  Change Zithromax to doxycycline given borderline QT prolongation.  Lactic acidosis: No signs of sepsis, I suspect that this is  more related to intravascular volume depletion.  Getting hydrated.    Senile dementia, with behavioral disturbance/acute encephalopathy (Thomas Mccarty): Likely in the setting of acute infection and change of environment.  As needed Ativan and will try to minimize disruptions.  Try to avoid Haldol due to borderline QT prolongation.  Nutrition consult to address status ordered.    Rhabdomyolysis: Continue IV fluid.  Follow-up CK level this afternoon.   Code Status: DNR  Family Communication: Son at bedside  Disposition Plan: Discussed with patient's son.  It would not be safe for him to return back to his home, certainly by himself.  As he continues to improve, will get PT and OT evaluation.  He may end up needing skilled nursing or at the very least assisted living.   Consultants:  None  Procedures:  None  Antimicrobials:  IV Rocephin 10/25-present  IV doxycycline 10/26-present  IV Zithromax 10/25-10/26  DVT prophylaxis: Lovenox   Objective: Vitals:   11/30/19 1000 11/30/19 1515  BP: (!) 132/49 (!) 149/77  Pulse: (!) 49 74  Resp: 13 19  Temp:    SpO2: 100% 98%    Intake/Output Summary (Last 24 hours) at 11/30/2019 1542 Last data filed at 11/30/2019 0739 Gross per 24 hour  Intake 1000 ml  Output 2200 ml  Net -1200 ml   Filed Weights   11/29/19 1502  Weight: 65.8 kg   Body mass index is 19.67 kg/m.  Exam:   General: Alert and oriented x1, no acute distress.  In early morning.  Became more agitated as day progressed.  HEENT: Normocephalic, atraumatic, mucous membranes are dry  Neck: Supple, no JVD  Cardiovascular: Regular  rate and rhythm, S1-S2  Respiratory: Clear to auscultation bilaterally, decreased at bases  Abdomen: Soft, nontender, nondistended, hypoactive bowel sounds  Musculoskeletal: No clubbing or cyanosis or edema  Skin: Multiple skin tags, discolorations.  Psychiatry: Confused with some episodes of acute agitation  Neuro: No overt focal  deficits   Data Reviewed: CBC: Recent Labs  Lab 11/29/19 1510 11/30/19 0255  WBC 12.5* 13.1*  HGB 12.9* 11.6*  HCT 39.3 36.1*  MCV 91.8 92.6  PLT 239 749   Basic Metabolic Panel: Recent Labs  Lab 11/29/19 1510 11/30/19 0255  NA 136 137  K 4.2 3.7  CL 98 102  CO2 25 24  GLUCOSE 115* 95  BUN 21 22  CREATININE 0.89 0.91  CALCIUM 9.4 8.8*   GFR: Estimated Creatinine Clearance: 48.2 mL/min (by C-G formula based on SCr of 0.91 mg/dL). Liver Function Tests: Recent Labs  Lab 11/29/19 1510 11/30/19 0255  AST 74* 94*  ALT 27 30  ALKPHOS 69 60  BILITOT 1.6* 1.1  PROT 7.7 6.4*  ALBUMIN 4.2 3.5   No results for input(s): LIPASE, AMYLASE in the last 168 hours. No results for input(s): AMMONIA in the last 168 hours. Coagulation Profile: No results for input(s): INR, PROTIME in the last 168 hours. Cardiac Enzymes: Recent Labs  Lab 11/29/19 1510  CKTOTAL 2,740*   BNP (last 3 results) No results for input(s): PROBNP in the last 8760 hours. HbA1C: No results for input(s): HGBA1C in the last 72 hours. CBG: Recent Labs  Lab 11/29/19 1521  GLUCAP 107*   Lipid Profile: No results for input(s): CHOL, HDL, LDLCALC, TRIG, CHOLHDL, LDLDIRECT in the last 72 hours. Thyroid Function Tests: No results for input(s): TSH, T4TOTAL, FREET4, T3FREE, THYROIDAB in the last 72 hours. Anemia Panel: No results for input(s): VITAMINB12, FOLATE, FERRITIN, TIBC, IRON, RETICCTPCT in the last 72 hours. Urine analysis:    Component Value Date/Time   COLORURINE YELLOW 11/29/2019 [place] 11/29/2019 1615   LABSPEC 1.015 11/29/2019 1615   PHURINE 6.0 11/29/2019 1615   GLUCOSEU NEGATIVE 11/29/2019 1615   HGBUR NEGATIVE 11/29/2019 1615   BILIRUBINUR NEGATIVE 11/29/2019 1615   KETONESUR 5 (A) 11/29/2019 1615   PROTEINUR NEGATIVE 11/29/2019 1615   UROBILINOGEN 0.2 03/18/2011 1149   NITRITE NEGATIVE 11/29/2019 1615   LEUKOCYTESUR NEGATIVE 11/29/2019 1615   Sepsis  Labs: @LABRCNTIP (procalcitonin:4,lacticidven:4)  ) Recent Results (from the past 240 hour(s))  Resp Panel by RT PCR (RSV, Flu A&B, Covid) - Nasopharyngeal Swab     Status: None   Collection Time: 11/29/19  3:11 PM   Specimen: Nasopharyngeal Swab  Result Value Ref Range Status   SARS Coronavirus 2 by RT PCR NEGATIVE NEGATIVE Final    Comment: (NOTE) SARS-CoV-2 target nucleic acids are NOT DETECTED.  The SARS-CoV-2 RNA is generally detectable in upper respiratoy specimens during the acute phase of infection. The lowest concentration of SARS-CoV-2 viral copies this assay can detect is 131 copies/mL. A negative result does not preclude SARS-Cov-2 infection and should not be used as the sole basis for treatment or other patient management decisions. A negative result may occur with  improper specimen collection/handling, submission of specimen other than nasopharyngeal swab, presence of viral mutation(s) within the areas targeted by this assay, and inadequate number of viral copies (<131 copies/mL). A negative result must be combined with clinical observations, patient history, and epidemiological information. The expected result is Negative.  Fact Sheet for Patients:  PinkCheek.be  Fact Sheet for Healthcare Providers:  GravelBags.it  This test is no t yet approved or cleared by the [place] and  has been authorized for detection and/or diagnosis of SARS-CoV-2 by FDA under an Emergency Use Authorization (EUA). This EUA will remain  in effect (meaning this test can be used) for the duration of the COVID-19 declaration under Section 564(b)(1) of the Act, 21 U.S.C. section 360bbb-3(b)(1), unless the authorization is terminated or revoked sooner.     Influenza A by PCR NEGATIVE NEGATIVE Final   Influenza B by PCR NEGATIVE NEGATIVE Final    Comment: (NOTE) The Xpert Xpress SARS-CoV-2/FLU/RSV assay is intended as an  aid in  the diagnosis of influenza from Nasopharyngeal swab specimens and  should not be used as a sole basis for treatment. Nasal washings and  aspirates are unacceptable for Xpert Xpress SARS-CoV-2/FLU/RSV  testing.  Fact Sheet for Patients: PinkCheek.be  Fact Sheet for Healthcare Providers: GravelBags.it  This test is not yet approved or cleared by the [place] FDA and  has been authorized for detection and/or diagnosis of SARS-CoV-2 by  FDA under an Emergency Use Authorization (EUA). This EUA will remain  in effect (meaning this test can be used) for the duration of the  Covid-19 declaration under Section 564(b)(1) of the Act, 21  U.S.C. section 360bbb-3(b)(1), unless the authorization is  terminated or revoked.    Respiratory Syncytial Virus by PCR NEGATIVE NEGATIVE Final    Comment: (NOTE) Fact Sheet for Patients: PinkCheek.be  Fact Sheet for Healthcare Providers: GravelBags.it  This test is not yet approved or cleared by the [place] FDA and  has been authorized for detection and/or diagnosis of SARS-CoV-2 by  FDA under an Emergency Use Authorization (EUA). This EUA will remain  in effect (meaning this test can be used) for the duration of the  COVID-19 declaration under Section 564(b)(1) of the Act, 21 U.S.C.  section 360bbb-3(b)(1), unless the authorization is terminated or  revoked. Performed at [facility], [place] [address], [place], [place] [address]   Urine culture     Status: None   Collection Time: 11/29/19  4:17 PM   Specimen: Urine, Catheterized  Result Value Ref Range Status   Specimen Description URINE, CATHETERIZED  Final   Special Requests NONE  Final   Culture   Final    NO GROWTH Performed at [facility], [address] [place], [place] [address]    Report Status 11/30/2019 FINAL  Final      Studies: CT  ABDOMEN PELVIS WO CONTRAST  Result Date: 11/29/2019 CLINICAL DATA:  Acute pain due to trauma. EXAM: CT CHEST, ABDOMEN AND PELVIS WITHOUT CONTRAST TECHNIQUE: Multidetector CT imaging of the chest, abdomen and pelvis was performed following the standard protocol without IV contrast. COMPARISON:  None. FINDINGS: CT CHEST FINDINGS Cardiovascular: The heart size is normal. There are coronary artery calcifications. There are atherosclerotic changes of the thoracic aorta. There is no significant pericardial effusion. Mediastinum/Nodes: -- No mediastinal lymphadenopathy. -- No hilar lymphadenopathy. -- No axillary lymphadenopathy. -- No supraclavicular lymphadenopathy. -- Normal thyroid gland where visualized. -  Unremarkable esophagus. Lungs/Pleura: There are few tree-in-bud airspace opacities in the right upper lobe. There is a focal area of consolidation involving the right lower lobe measuring approximately 1 cm (axial series 4, image 102). Small nodules are noted in the left lower lobe and lingula. There is no pneumothorax. No large pleural effusion. There is debris within the trachea and right mainstem bronchus. Musculoskeletal: No chest wall abnormality. No bony  spinal canal stenosis. CT ABDOMEN PELVIS FINDINGS Hepatobiliary: The liver is normal. Status post cholecystectomy.There is no biliary ductal dilation. Pancreas: Normal contours without ductal dilatation. No peripancreatic fluid collection. Spleen: Unremarkable. Adrenals/Urinary Tract: --Adrenal glands: Unremarkable. --Right kidney/ureter: No hydronephrosis or radiopaque kidney stones. --Left kidney/ureter: No hydronephrosis or radiopaque kidney stones. --Urinary bladder: Bladder is decompressed by Foley catheter Stomach/Bowel: --Stomach/Duodenum: No hiatal hernia or other gastric abnormality. Normal duodenal course and caliber. --Small bowel: Unremarkable. --Colon: There is a large amount of stool in the rectum. --Appendix: Not visualized. No right lower  quadrant inflammation or free fluid. Vascular/Lymphatic: Atherosclerotic calcification is present within the non-aneurysmal abdominal aorta, without hemodynamically significant stenosis. --No retroperitoneal lymphadenopathy. --No mesenteric lymphadenopathy. --No pelvic or inguinal lymphadenopathy. Reproductive: Unremarkable Other: No ascites or free air. The abdominal wall is normal. Musculoskeletal. No acute displaced fractures. IMPRESSION: 1. No acute traumatic injury to the chest, abdomen or pelvis. 2. Tree-in-bud airspace opacities bilaterally with a small focus of consolidation in the right lower lobe. These are favored to be infectious or inflammatory in etiology. However, a three-month follow-up CT is recommended to confirm stability or resolution. 3. Large amount of stool in the rectum. Aortic Atherosclerosis (ICD10-I70.0). Electronically Signed   By: Constance Holster M.D.   On: 11/29/2019 17:29   CT HEAD WO CONTRAST  Result Date: 11/29/2019 CLINICAL DATA:  Altered mental status, found down in his driveway. EXAM: CT HEAD WITHOUT CONTRAST TECHNIQUE: Contiguous axial images were obtained from the base of the skull through the vertex without intravenous contrast. COMPARISON:  January 25, 2011 FINDINGS: Brain: There is mild cerebral atrophy with widening of the extra-axial spaces and ventricular dilatation. There are areas of decreased attenuation within the white matter tracts of the supratentorial brain, consistent with microvascular disease changes. Vascular: No hyperdense vessel or unexpected calcification. Skull: Normal. Negative for fracture or focal lesion. Sinuses/Orbits: No acute finding. Other: Mild scalp soft tissue swelling is seen along the posterior aspect of the vertex on the left, with an associated 2.6 cm x 0.7 cm scalp soft tissue mass. IMPRESSION: 1. Mild scalp soft tissue swelling, and associated soft tissue mass, along the posterior aspect of the vertex on the left. Correlation with  physical examination and follow-up MRI is recommended to exclude an underlying neoplasm. 2. No acute intracranial abnormality. Electronically Signed   By: Virgina Norfolk M.D.   On: 11/29/2019 18:57   CT Chest Wo Contrast  Result Date: 11/29/2019 CLINICAL DATA:  Acute pain due to trauma. EXAM: CT CHEST, ABDOMEN AND PELVIS WITHOUT CONTRAST TECHNIQUE: Multidetector CT imaging of the chest, abdomen and pelvis was performed following the standard protocol without IV contrast. COMPARISON:  None. FINDINGS: CT CHEST FINDINGS Cardiovascular: The heart size is normal. There are coronary artery calcifications. There are atherosclerotic changes of the thoracic aorta. There is no significant pericardial effusion. Mediastinum/Nodes: -- No mediastinal lymphadenopathy. -- No hilar lymphadenopathy. -- No axillary lymphadenopathy. -- No supraclavicular lymphadenopathy. -- Normal thyroid gland where visualized. -  Unremarkable esophagus. Lungs/Pleura: There are few tree-in-bud airspace opacities in the right upper lobe. There is a focal area of consolidation involving the right lower lobe measuring approximately 1 cm (axial series 4, image 102). Small nodules are noted in the left lower lobe and lingula. There is no pneumothorax. No large pleural effusion. There is debris within the trachea and right mainstem bronchus. Musculoskeletal: No chest wall abnormality. No bony spinal canal stenosis. CT ABDOMEN PELVIS FINDINGS Hepatobiliary: The liver is normal. Status post cholecystectomy.There is no biliary  ductal dilation. Pancreas: Normal contours without ductal dilatation. No peripancreatic fluid collection. Spleen: Unremarkable. Adrenals/Urinary Tract: --Adrenal glands: Unremarkable. --Right kidney/ureter: No hydronephrosis or radiopaque kidney stones. --Left kidney/ureter: No hydronephrosis or radiopaque kidney stones. --Urinary bladder: Bladder is decompressed by Foley catheter Stomach/Bowel: --Stomach/Duodenum: No hiatal  hernia or other gastric abnormality. Normal duodenal course and caliber. --Small bowel: Unremarkable. --Colon: There is a large amount of stool in the rectum. --Appendix: Not visualized. No right lower quadrant inflammation or free fluid. Vascular/Lymphatic: Atherosclerotic calcification is present within the non-aneurysmal abdominal aorta, without hemodynamically significant stenosis. --No retroperitoneal lymphadenopathy. --No mesenteric lymphadenopathy. --No pelvic or inguinal lymphadenopathy. Reproductive: Unremarkable Other: No ascites or free air. The abdominal wall is normal. Musculoskeletal. No acute displaced fractures. IMPRESSION: 1. No acute traumatic injury to the chest, abdomen or pelvis. 2. Tree-in-bud airspace opacities bilaterally with a small focus of consolidation in the right lower lobe. These are favored to be infectious or inflammatory in etiology. However, a three-month follow-up CT is recommended to confirm stability or resolution. 3. Large amount of stool in the rectum. Aortic Atherosclerosis (ICD10-I70.0). Electronically Signed   By: Constance Holster M.D.   On: 11/29/2019 17:29   CT CERVICAL SPINE WO CONTRAST  Result Date: 11/29/2019 CLINICAL DATA:  Status post fall. EXAM: CT CERVICAL SPINE WITHOUT CONTRAST TECHNIQUE: Multidetector CT imaging of the cervical spine was performed without intravenous contrast. Multiplanar CT image reconstructions were also generated. COMPARISON:  January 07, 2015 FINDINGS: Alignment: Approximately 2 mm to 3 mm retrolisthesis of the C2 vertebral body is noted on C3. Skull base and vertebrae: No acute fracture. No primary bone lesion or focal pathologic process. Soft tissues and spinal canal: No prevertebral fluid or swelling. No visible canal hematoma. Disc levels: Moderate severity multilevel endplate sclerosis and anterior osteophyte formation is seen throughout the cervical spine with calcification of the anterior longitudinal ligament. There is marked  severity narrowing of the anterior atlantoaxial articulation. Moderate to marked severity multilevel intervertebral disc space narrowing is seen. Marked severity bilateral multilevel facet joint hypertrophy is noted. Upper chest: Negative. Other: None. IMPRESSION: 1. No acute fracture within the cervical spine. 2. Marked severity multilevel degenerative changes. 3. Approximately 2 mm to 3 mm retrolisthesis of the C2 vertebral body on C3. Electronically Signed   By: Virgina Norfolk M.D.   On: 11/29/2019 17:22   DG Chest Port 1 View  Result Date: 11/29/2019 CLINICAL DATA:  Altered mental status EXAM: PORTABLE CHEST 1 VIEW COMPARISON:  Radiograph 11/13/2015 FINDINGS: Chronic hyperinflation with coarsened interstitial changes and likely scarring in the right mid lung. No new consolidative opacity is seen. No pneumothorax or visible effusion. The aorta is calcified. The remaining cardiomediastinal contours are unremarkable. No acute osseous or soft tissue abnormality. Degenerative changes are present in the imaged spine and shoulders. Surgical clips noted in the left axilla. Telemetry leads overlie the chest. IMPRESSION: No acute cardiopulmonary abnormality. Chronic hyperinflation and coarsened interstitial changes. Aortic Atherosclerosis (ICD10-I70.0). Electronically Signed   By: Lovena Le M.D.   On: 11/29/2019 15:53    Scheduled Meds: . enoxaparin (LOVENOX) injection  40 mg Subcutaneous Q24H  . sodium chloride flush  3 mL Intravenous Q12H    Continuous Infusions: . sodium chloride 100 mL/hr at 11/30/19 0602  . cefTRIAXone (ROCEPHIN)  IV    . doxycycline (VIBRAMYCIN) IV Stopped (11/30/19 1515)     LOS: 1 day     Annita Brod, MD [facility]   11/30/2019, 3:42 PM

## 2019-12-01 ENCOUNTER — Inpatient Hospital Stay (HOSPITAL_COMMUNITY): Payer: Medicare Other

## 2019-12-01 DIAGNOSIS — G9340 Encephalopathy, unspecified: Secondary | ICD-10-CM | POA: Diagnosis not present

## 2019-12-01 LAB — COMPREHENSIVE METABOLIC PANEL
ALT: 38 U/L (ref 0–44)
AST: 110 U/L — ABNORMAL HIGH (ref 15–41)
Albumin: 3.1 g/dL — ABNORMAL LOW (ref 3.5–5.0)
Alkaline Phosphatase: 61 U/L (ref 38–126)
Anion gap: 14 (ref 5–15)
BUN: 24 mg/dL — ABNORMAL HIGH (ref 8–23)
CO2: 17 mmol/L — ABNORMAL LOW (ref 22–32)
Calcium: 8.6 mg/dL — ABNORMAL LOW (ref 8.9–10.3)
Chloride: 108 mmol/L (ref 98–111)
Creatinine, Ser: 0.9 mg/dL (ref 0.61–1.24)
GFR, Estimated: >60 mL/min (ref >60)
Glucose, Bld: 86 mg/dL (ref 70–99)
Potassium: 3.2 mmol/L — ABNORMAL LOW (ref 3.5–5.1)
Sodium: 139 mmol/L (ref 135–145)
Total Bilirubin: 1.4 mg/dL — ABNORMAL HIGH (ref 0.3–1.2)
Total Protein: 6.2 g/dL — ABNORMAL LOW (ref 6.5–8.1)

## 2019-12-01 LAB — VITAMIN D 25 HYDROXY (VIT D DEFICIENCY, FRACTURES): Vit D, 25-Hydroxy: 21.16 ng/mL — ABNORMAL LOW (ref 30–100)

## 2019-12-01 LAB — CBC
HCT: 36 % — ABNORMAL LOW (ref 39.0–52.0)
Hemoglobin: 11.9 g/dL — ABNORMAL LOW (ref 13.0–17.0)
MCH: 29.8 pg (ref 26.0–34.0)
MCHC: 33.1 g/dL (ref 30.0–36.0)
MCV: 90 fL (ref 80.0–100.0)
Platelets: 202 10*3/uL (ref 150–400)
RBC: 4 MIL/uL — ABNORMAL LOW (ref 4.22–5.81)
RDW: 13.4 % (ref 11.5–15.5)
WBC: 13.9 10*3/uL — ABNORMAL HIGH (ref 4.0–10.5)
nRBC: 0 % (ref 0.0–0.2)

## 2019-12-01 LAB — TSH: TSH: 4.991 u[IU]/mL — ABNORMAL HIGH (ref 0.350–4.500)

## 2019-12-01 MED ORDER — POTASSIUM CHLORIDE CRYS ER 20 MEQ PO TBCR
40.0000 meq | EXTENDED_RELEASE_TABLET | Freq: Once | ORAL | Status: DC
  Filled 2019-12-01: qty 2

## 2019-12-01 MED ORDER — SENNOSIDES-DOCUSATE SODIUM 8.6-50 MG PO TABS
1.0000 | ORAL_TABLET | Freq: Two times a day (BID) | ORAL | Status: DC
  Filled 2019-12-01: qty 1

## 2019-12-01 NOTE — TOC Initial Note (Signed)
Transition of Care (TOC) - Initial/Assessment Note    Patient Details  Name: Thomas Mccarty. MRN: [id number] Date of Birth: [birth year]  [facility]) CM/SW Contact:    Pollie Friar, RN Phone Number: 12/01/2019, 3:12 PM  Clinical Narrative:                 CM met with the patient and 2 daughters at the bedside. They are requesting residential hospice in the [place] area. They provided the name; [facility]. CM did update them that they most likely would be responsible for the ambulance transport out of the area. They understand and are in agreement.  CM has contacted Tom with [facility]. I have faxed him the information requested. They will review and see if they are able to offer a bed.  TOC following for determination.   Expected Discharge Plan: Hospice Medical Facility Barriers to Discharge: Hospice Bed not available   Patient Goals and CMS Choice   CMS Medicare.gov Compare Post Acute Care list provided to:: Patient Represenative (must comment) Choice offered to / list presented to : Adult Children  Expected Discharge Plan and Services Expected Discharge Plan: [place]   Discharge Planning Services: CM Consult Post Acute Care Choice: Hospice Living arrangements for the past 2 months: Single Family Home                                      Prior Living Arrangements/Services Living arrangements for the past 2 months: Single Family Home Lives with:: Self Patient language and need for interpreter reviewed:: Yes        Need for Family Participation in Patient Care: Yes (Comment) Care giver support system in place?: No (comment)   Criminal Activity/Legal Involvement Pertinent to Current Situation/Hospitalization: No - Comment as needed  Activities of Daily Living Home Assistive Devices/Equipment: None ADL Screening (condition at time of admission) Patient's cognitive ability adequate to  safely complete daily activities?: No Is the patient deaf or have difficulty hearing?: Yes Does the patient have difficulty seeing, even when wearing glasses/contacts?: Yes Does the patient have difficulty concentrating, remembering, or making decisions?: Yes Patient able to express need for assistance with ADLs?: No Does the patient have difficulty dressing or bathing?: Yes Independently performs ADLs?: No Communication: Dependent Is this a change from baseline?: Pre-admission baseline Dressing (OT): Dependent Is this a change from baseline?: Pre-admission baseline Grooming: Dependent Is this a change from baseline?: Pre-admission baseline Bathing: Dependent Is this a change from baseline?: Pre-admission baseline Toileting: Dependent Is this a change from baseline?: Pre-admission baseline In/Out Bed: Dependent Is this a change from baseline?: Pre-admission baseline Walks in Home: Dependent Is this a change from baseline?: Pre-admission baseline Does the patient have difficulty walking or climbing stairs?: Yes Weakness of Legs: Both Weakness of Arms/Hands: Both  Permission Sought/Granted                  Emotional Assessment Appearance:: Appears stated age         Psych Involvement: No (comment)  Admission diagnosis:  Dehydration [E86.0] Urinary retention [R33.9] Acute encephalopathy [G93.40] Non-traumatic rhabdomyolysis [M62.82] Altered mental status, unspecified altered mental status type [R41.82] Patient Active Problem List   Diagnosis Date Noted  . Senile dementia, with behavioral disturbance ([place]) 11/30/2019  . Rhabdomyolysis 11/30/2019  . Acute encephalopathy 11/29/2019  . CAP (community acquired pneumonia)  11/29/2019  . Squamous cell carcinoma 01/07/2017  . Melanoma of back ([place]) 09/17/2011  . Degenerative drusen 05/03/2011  . Macular degeneration 05/03/2011  . Occlusion and stenosis of carotid artery without mention of cerebral infarction 03/13/2011  .  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 Carotid stenosis 01/26/2011  . Vertigo 01/25/2011  . HTN (hypertension) 01/25/2011  . Spinal stenosis 01/25/2011  . GERD (gastroesophageal reflux disease) 01/25/2011  . Hyperlipidemia 01/25/2011  . Cataract 11/21/2010   PCP:  Marda Stalker, PA-C Pharmacy:   [id number] [place], [id number]: [phone]: [phone]    Social Determinants of Health (SDOH) Interventions    Readmission Risk Interventions No flowsheet data found.

## 2019-12-01 NOTE — Evaluation (Signed)
Physical Therapy Evaluation and Discharge Patient Details Name: Thomas Mccarty. MRN: [id number] DOB: 04-20-1925 Today's Date: 12/01/2019   History of Present Illness  Pt is a 86 y/o male admitted following fall. Found to have acute metabolic encephalopathy, rhabdomyolysis. PMH includes CAD, HTN, and dementia  Clinical Impression  Pt admitted secondary to problem above with deficits below. Pt requiring total A +2 for bed mobility and mod to max A +2 to stand and transfer to [facility]. Per daughters, they plan to pursue hospice services. However, pt currently lives alone and does not have 24/7 support. Pt also has multiple steps inside and outside of home per daughters and is requiring +2 assist. Feel pt would benefit from SNF level care at d/c. All further needs can be met at [facility]. Will sign off. If needs change, please re-consult.     Follow Up Recommendations SNF;Supervision/Assistance - 24 hour    Equipment Recommendations  Wheelchair (measurements PT);Wheelchair cushion (measurements PT);Hospital bed (hoyer lift with pad)    Recommendations for Other Services       Precautions / Restrictions Precautions Precautions: Fall Restrictions Weight Bearing Restrictions: No      Mobility  Bed Mobility Overal bed mobility: Needs Assistance Bed Mobility: Sit to Supine;Supine to Sit     Supine to sit: Total assist;+2 for physical assistance Sit to supine: Total assist;+2 for physical assistance   General bed mobility comments: total A for bed mobility. Required max cues for initiation of bed mobility.     Transfers Overall transfer level: Needs assistance Equipment used: 2 person hand held assist Transfers: Sit to/from [facility] Sit to Stand: Mod assist;+2 physical assistance Stand pivot transfers: Mod assist;Max assist;+2 physical assistance       General transfer comment: Mod A +2 for lift assist and steadying to stand. Pt with difficulty taking steps towards  BSC and back to bed and required max A +2.   Ambulation/Gait                Stairs            Wheelchair Mobility    Modified Rankin (Stroke Patients Only)       Balance Overall balance assessment: Needs assistance Sitting-balance support: No upper extremity supported;Feet supported Sitting balance-Leahy Scale: Fair     Standing balance support: Bilateral upper extremity supported;During functional activity Standing balance-Leahy Scale: Poor Standing balance comment: Reliant on mod to max A for support                              Pertinent Vitals/Pain Pain Assessment: Faces Faces Pain Scale: Hurts little more Pain Location: generalized Pain Descriptors / Indicators: Grimacing;Guarding Pain Intervention(s): Limited activity within patient's tolerance;Monitored during session;Repositioned    Home Living Family/patient expects to be discharged to:: Skilled nursing facility Living Arrangements: Alone                    Prior Function Level of Independence: Independent               Hand Dominance        Extremity/Trunk Assessment   Upper Extremity Assessment Upper Extremity Assessment: Generalized weakness;Difficult to assess due to impaired cognition    Lower Extremity Assessment Lower Extremity Assessment: Generalized weakness;RLE deficits/detail;LLE deficits/detail;Difficult to assess due to impaired cognition RLE Deficits / Details: BLE pain secondary to lesions on legs LLE Deficits / Details: BLE pain secondary to lesions on  legs       Communication   Communication: HOH (reads lips )  Cognition Arousal/Alertness: Awake/alert Behavior During Therapy: Flat affect;Restless Overall Cognitive Status: History of cognitive impairments - at baseline                                 General Comments: Dementia at baseline. Pt beoming restless towards end of session, but was easily redirected with help from  family.       General Comments General comments (skin integrity, edema, etc.): Pt's daughters present. Reports they plan to have hospice services, but reports they cannot provide 24/7 support and that pt has very inaccessible home.     Exercises     Assessment/Plan    PT Assessment All further PT needs can be met in the next venue of care  PT Problem List Decreased strength;Decreased balance;Decreased activity tolerance;Decreased mobility;Decreased cognition;Decreased knowledge of use of DME;Decreased safety awareness;Decreased knowledge of precautions       PT Treatment Interventions      PT Goals (Current goals can be found in the Care Plan section)  Acute Rehab PT Goals Patient Stated Goal: to have pt at a facility with hospice services per family.  PT Goal Formulation: With family Time For Goal Achievement: 12/01/19 Potential to Achieve Goals: Poor    Frequency     Barriers to discharge Decreased caregiver support      Co-evaluation               AM-PAC PT "6 Clicks" Mobility  Outcome Measure Help needed turning from your back to your side while in a flat bed without using bedrails?: Total Help needed moving from lying on your back to sitting on the side of a flat bed without using bedrails?: Total Help needed moving to and from a bed to a chair (including a wheelchair)?: Total Help needed standing up from a chair using your arms (e.g., wheelchair or bedside chair)?: A Lot Help needed to walk in hospital room?: Total Help needed climbing 3-5 steps with a railing? : Total 6 Click Score: 7    End of Session Equipment Utilized During Treatment: Gait belt Activity Tolerance: Patient tolerated treatment well Patient left: in bed;with call bell/phone within reach;with bed alarm set;with family/visitor present Nurse Communication: Mobility status PT Visit Diagnosis: Unsteadiness on feet (R26.81);Muscle weakness (generalized) (M62.81);History of falling (Z91.81)     Time: 1121-1202 PT Time Calculation (min) (ACUTE ONLY): 41 min   Charges:   PT Evaluation $PT Eval Moderate Complexity: 1 Mod PT Treatments $Therapeutic Activity: 23-37 mins        Lou Miner, DPT  Acute Rehabilitation Services  Pager: [phone] Office: [phone]   Rudean Hitt 12/01/2019, 12:44 PM

## 2019-12-01 NOTE — Progress Notes (Signed)
Noticed increased blood draining from foley catheter, no blot clots seen. Patient has safety mittens on, but ale to tug on line at times.Notified MD, new orders placed. Will continue to moniitor.

## 2019-12-01 NOTE — Progress Notes (Signed)
PROGRESS NOTE    Thomas Mccarty.  PIR:[id number] DOB: [birth year] DOA: 11/29/2019 PCP: Marda Stalker, PA-C   Brief Narrative:  86 year old male with past medical history of GERD, hypertension and likely dementia who lives alone with family close by but has been relatively independent and was brought into the emergency room after being found down by a neighbor on the afternoon of 10/25.  According to family, he has had dementia that is been slowly progressing over the past few years, but has stubbornly maintained his independence.  In the emergency room, patient was found to have a leukocytosis of 12.5, CK level of 2740 lactic acid level of 2.4 and chest x-ray concerning for possible pneumonia.  Although he was found down, he was confused but awake in the emergency room.  He was admitted to the hospitalist service for rhabdomyolysis and treatment of pneumonia.    This morning, procalcitonin level elevated at 0.24.  White blood cell count slightly increased to 13.  Patient still confused but awake.  By afternoon, more agitated requiring doses of Ativan.  Patient himself unable to communicate with me, although he is awake.   Assessment & Plan:   Principal Problem:   Acute encephalopathy Active Problems:   HTN (hypertension)   GERD (gastroesophageal reflux disease)   Hyperlipidemia   CAP (community acquired pneumonia)   Senile dementia, with behavioral disturbance (HCC)   Rhabdomyolysis  Acute metabolic encephalopathy secondary to community-acquired pneumonia -Procalcitonin mildly elevated.  As needed bronchodilators.  Incentive spirometer and flutter valve. -Continue antibiotics-Rocephin and doxycycline.  Lactic acidosis with mild to moderate dehydration Mild rhabdomyolysis -Improved with IV fluids.  Hematuria with enlarged prostate concerns of neoplasm Urinary retention with Foley catheter in place -Had extensive discussion with the patient's family, they request not to pursue  any further work-up at this time and would like to explore hospice options.  Hospice team has been consulted. -Foley catheter to be adjusted.  Essential hypertension -Resume home meds  Family agrees to comfort feeding at this time, limited intervention.  Would like to explore hospice options therefore home hospice team has been consulted.  DVT prophylaxis: enoxaparin (LOVENOX) injection 40 mg Start: 11/29/19 1915  Code Status: DNR Family Communication: Daughter at bedside  Status is: Inpatient  Remains inpatient appropriate because:Inpatient level of care appropriate due to severity of illness   Dispo: The patient is from: Home              Anticipated d/c is to: Home              Anticipated d/c date is: 1 day              Patient currently is not medically stable to d/c.  Currently getting IV treatment, unable to do any p.o. intake.  Advised for comfort feeding which family is agreeable to at this time.  Home hospice team has been consulted.       Body mass index is 17.88 kg/m.      Subjective: Patient seen and examined at bedside, difficult for him to communicate clearly. Daughter is at bedside what extensive discussion with regarding goals of care-she is in agreement that patient should be transition to hospice after she discusses with home hospice and would like to proceed with comfort feeding.  Review of Systems Otherwise negative except as per HPI, including: Difficult to obtain  Examination:  Constitutional: Appears chronically ill, bilateral temporal wasting Respiratory: Bilateral rhonchi Cardiovascular: Normal sinus rhythm, no rubs Abdomen: Nontender nondistended  good bowel sounds Musculoskeletal: No edema noted Skin: No rashes seen Neurologic: CN 2-12 grossly intact.  And nonfocal Psychiatric: Poor judgment and insight Foley in place with dark urine Objective: Vitals:   12/01/19 0300 12/01/19 0800 12/01/19 0837 12/01/19 1110  BP: (!) 146/65  (!)  164/99 (!) 159/58  Pulse: 75  61 (!) 54  Resp: 20 16 20 18   Temp: 98.7 F (37.1 C)  98.1 F (36.7 C) 100.2 F (37.9 C)  TempSrc: Axillary  Oral Oral  SpO2: 95% 95% 99% 94%  Weight: 59.8 kg     Height:        Intake/Output Summary (Last 24 hours) at 12/01/2019 1358 Last data filed at 12/01/2019 0018 Gross per 24 hour  Intake 2244.76 ml  Output 800 ml  Net 1444.76 ml   Filed Weights   11/29/19 1502 12/01/19 0300  Weight: 65.8 kg 59.8 kg     Data Reviewed:   CBC: Recent Labs  Lab 11/29/19 1510 11/30/19 0255 11/30/19 1733 12/01/19 0409  WBC 12.5* 13.1* 12.4* 13.9*  HGB 12.9* 11.6* 11.4* 11.9*  HCT 39.3 36.1* 34.7* 36.0*  MCV 91.8 92.6 92.5 90.0  PLT 239 210 200 644   Basic Metabolic Panel: Recent Labs  Lab 11/29/19 1510 11/30/19 0255 12/01/19 0409  NA 136 137 139  K 4.2 3.7 3.2*  CL 98 102 108  CO2 25 24 17*  GLUCOSE 115* 95 86  BUN 21 22 24*  CREATININE 0.89 0.91 0.90  CALCIUM 9.4 8.8* 8.6*   GFR: Estimated Creatinine Clearance: 44.3 mL/min (by C-G formula based on SCr of 0.9 mg/dL). Liver Function Tests: Recent Labs  Lab 11/29/19 1510 11/30/19 0255 12/01/19 0409  AST 74* 94* 110*  ALT 27 30 38  ALKPHOS 69 60 61  BILITOT 1.6* 1.1 1.4*  PROT 7.7 6.4* 6.2*  ALBUMIN 4.2 3.5 3.1*   No results for input(s): LIPASE, AMYLASE in the last 168 hours. No results for input(s): AMMONIA in the last 168 hours. Coagulation Profile: No results for input(s): INR, PROTIME in the last 168 hours. Cardiac Enzymes: Recent Labs  Lab 11/29/19 1510 11/30/19 1733  CKTOTAL 2,740* 2,419*   BNP (last 3 results) No results for input(s): PROBNP in the last 8760 hours. HbA1C: No results for input(s): HGBA1C in the last 72 hours. CBG: Recent Labs  Lab 11/29/19 1521  GLUCAP 107*   Lipid Profile: No results for input(s): CHOL, HDL, LDLCALC, TRIG, CHOLHDL, LDLDIRECT in the last 72 hours. Thyroid Function Tests: Recent Labs    12/01/19 1037  TSH 4.991*    Anemia Panel: No results for input(s): VITAMINB12, FOLATE, FERRITIN, TIBC, IRON, RETICCTPCT in the last 72 hours. Sepsis Labs: Recent Labs  Lab 11/29/19 1522 11/30/19 1008 11/30/19 1733  PROCALCITON  --  0.27 0.27  LATICACIDVEN 2.4*  --  0.9    Recent Results (from the past 240 hour(s))  Resp Panel by RT PCR (RSV, Flu A&B, Covid) - Nasopharyngeal Swab     Status: None   Collection Time: 11/29/19  3:11 PM   Specimen: Nasopharyngeal Swab  Result Value Ref Range Status   SARS Coronavirus 2 by RT PCR NEGATIVE NEGATIVE Final    Comment: (NOTE) SARS-CoV-2 target nucleic acids are NOT DETECTED.  The SARS-CoV-2 RNA is generally detectable in upper respiratoy specimens during the acute phase of infection. The lowest concentration of SARS-CoV-2 viral copies this assay can detect is 131 copies/mL. A negative result does not preclude SARS-Cov-2 infection and should not be used  as the sole basis for treatment or other patient management decisions. A negative result may occur with  improper specimen collection/handling, submission of specimen other than nasopharyngeal swab, presence of viral mutation(s) within the areas targeted by this assay, and inadequate number of viral copies (<131 copies/mL). A negative result must be combined with clinical observations, patient history, and epidemiological information. The expected result is Negative.  Fact Sheet for Patients:  PinkCheek.be  Fact Sheet for Healthcare Providers:  GravelBags.it  This test is no t yet approved or cleared by the [place] FDA and  has been authorized for detection and/or diagnosis of SARS-CoV-2 by FDA under an Emergency Use Authorization (EUA). This EUA will remain  in effect (meaning this test can be used) for the duration of the COVID-19 declaration under Section 564(b)(1) of the Act, 21 U.S.C. section 360bbb-3(b)(1), unless the authorization is  terminated or revoked sooner.     Influenza A by PCR NEGATIVE NEGATIVE Final   Influenza B by PCR NEGATIVE NEGATIVE Final    Comment: (NOTE) The Xpert Xpress SARS-CoV-2/FLU/RSV assay is intended as an aid in  the diagnosis of influenza from Nasopharyngeal swab specimens and  should not be used as a sole basis for treatment. Nasal washings and  aspirates are unacceptable for Xpert Xpress SARS-CoV-2/FLU/RSV  testing.  Fact Sheet for Patients: PinkCheek.be  Fact Sheet for Healthcare Providers: GravelBags.it  This test is not yet approved or cleared by the [place] FDA and  has been authorized for detection and/or diagnosis of SARS-CoV-2 by  FDA under an Emergency Use Authorization (EUA). This EUA will remain  in effect (meaning this test can be used) for the duration of the  Covid-19 declaration under Section 564(b)(1) of the Act, 21  U.S.C. section 360bbb-3(b)(1), unless the authorization is  terminated or revoked.    Respiratory Syncytial Virus by PCR NEGATIVE NEGATIVE Final    Comment: (NOTE) Fact Sheet for Patients: PinkCheek.be  Fact Sheet for Healthcare Providers: GravelBags.it  This test is not yet approved or cleared by the [place] FDA and  has been authorized for detection and/or diagnosis of SARS-CoV-2 by  FDA under an Emergency Use Authorization (EUA). This EUA will remain  in effect (meaning this test can be used) for the duration of the  COVID-19 declaration under Section 564(b)(1) of the Act, 21 U.S.C.  section 360bbb-3(b)(1), unless the authorization is terminated or  revoked. Performed at [facility], [place] [address], [place], [place] [address]   Urine culture     Status: None   Collection Time: 11/29/19  4:17 PM   Specimen: Urine, Catheterized  Result Value Ref Range Status   Specimen Description URINE, CATHETERIZED  Final    Special Requests NONE  Final   Culture   Final    NO GROWTH Performed at [facility], [address], [place], [place] [address]    Report Status 11/30/2019 FINAL  Final         Radiology Studies: CT ABDOMEN PELVIS WO CONTRAST  Result Date: 11/29/2019 CLINICAL DATA:  Acute pain due to trauma. EXAM: CT CHEST, ABDOMEN AND PELVIS WITHOUT CONTRAST TECHNIQUE: Multidetector CT imaging of the chest, abdomen and pelvis was performed following the standard protocol without IV contrast. COMPARISON:  None. FINDINGS: CT CHEST FINDINGS Cardiovascular: The heart size is normal. There are coronary artery calcifications. There are atherosclerotic changes of the thoracic aorta. There is no significant pericardial effusion. Mediastinum/Nodes: -- No mediastinal lymphadenopathy. -- No hilar lymphadenopathy. --  No axillary lymphadenopathy. -- No supraclavicular lymphadenopathy. -- Normal thyroid gland where visualized. -  Unremarkable esophagus. Lungs/Pleura: There are few tree-in-bud airspace opacities in the right upper lobe. There is a focal area of consolidation involving the right lower lobe measuring approximately 1 cm (axial series 4, image 102). Small nodules are noted in the left lower lobe and lingula. There is no pneumothorax. No large pleural effusion. There is debris within the trachea and right mainstem bronchus. Musculoskeletal: No chest wall abnormality. No bony spinal canal stenosis. CT ABDOMEN PELVIS FINDINGS Hepatobiliary: The liver is normal. Status post cholecystectomy.There is no biliary ductal dilation. Pancreas: Normal contours without ductal dilatation. No peripancreatic fluid collection. Spleen: Unremarkable. Adrenals/Urinary Tract: --Adrenal glands: Unremarkable. --Right kidney/ureter: No hydronephrosis or radiopaque kidney stones. --Left kidney/ureter: No hydronephrosis or radiopaque kidney stones. --Urinary bladder: Bladder is decompressed by Foley catheter Stomach/Bowel:  --Stomach/Duodenum: No hiatal hernia or other gastric abnormality. Normal duodenal course and caliber. --Small bowel: Unremarkable. --Colon: There is a large amount of stool in the rectum. --Appendix: Not visualized. No right lower quadrant inflammation or free fluid. Vascular/Lymphatic: Atherosclerotic calcification is present within the non-aneurysmal abdominal aorta, without hemodynamically significant stenosis. --No retroperitoneal lymphadenopathy. --No mesenteric lymphadenopathy. --No pelvic or inguinal lymphadenopathy. Reproductive: Unremarkable Other: No ascites or free air. The abdominal wall is normal. Musculoskeletal. No acute displaced fractures. IMPRESSION: 1. No acute traumatic injury to the chest, abdomen or pelvis. 2. Tree-in-bud airspace opacities bilaterally with a small focus of consolidation in the right lower lobe. These are favored to be infectious or inflammatory in etiology. However, a three-month follow-up CT is recommended to confirm stability or resolution. 3. Large amount of stool in the rectum. Aortic Atherosclerosis (ICD10-I70.0). Electronically Signed   By: Constance Holster M.D.   On: 11/29/2019 17:29   CT HEAD WO CONTRAST  Result Date: 11/29/2019 CLINICAL DATA:  Altered mental status, found down in his driveway. EXAM: CT HEAD WITHOUT CONTRAST TECHNIQUE: Contiguous axial images were obtained from the base of the skull through the vertex without intravenous contrast. COMPARISON:  January 25, 2011 FINDINGS: Brain: There is mild cerebral atrophy with widening of the extra-axial spaces and ventricular dilatation. There are areas of decreased attenuation within the white matter tracts of the supratentorial brain, consistent with microvascular disease changes. Vascular: No hyperdense vessel or unexpected calcification. Skull: Normal. Negative for fracture or focal lesion. Sinuses/Orbits: No acute finding. Other: Mild scalp soft tissue swelling is seen along the posterior aspect of the  vertex on the left, with an associated 2.6 cm x 0.7 cm scalp soft tissue mass. IMPRESSION: 1. Mild scalp soft tissue swelling, and associated soft tissue mass, along the posterior aspect of the vertex on the left. Correlation with physical examination and follow-up MRI is recommended to exclude an underlying neoplasm. 2. No acute intracranial abnormality. Electronically Signed   By: Virgina Norfolk M.D.   On: 11/29/2019 18:57   CT Chest Wo Contrast  Result Date: 11/29/2019 CLINICAL DATA:  Acute pain due to trauma. EXAM: CT CHEST, ABDOMEN AND PELVIS WITHOUT CONTRAST TECHNIQUE: Multidetector CT imaging of the chest, abdomen and pelvis was performed following the standard protocol without IV contrast. COMPARISON:  None. FINDINGS: CT CHEST FINDINGS Cardiovascular: The heart size is normal. There are coronary artery calcifications. There are atherosclerotic changes of the thoracic aorta. There is no significant pericardial effusion. Mediastinum/Nodes: -- No mediastinal lymphadenopathy. -- No hilar lymphadenopathy. -- No axillary lymphadenopathy. -- No supraclavicular lymphadenopathy. -- Normal thyroid gland where visualized. -  Unremarkable esophagus. Lungs/Pleura:  There are few tree-in-bud airspace opacities in the right upper lobe. There is a focal area of consolidation involving the right lower lobe measuring approximately 1 cm (axial series 4, image 102). Small nodules are noted in the left lower lobe and lingula. There is no pneumothorax. No large pleural effusion. There is debris within the trachea and right mainstem bronchus. Musculoskeletal: No chest wall abnormality. No bony spinal canal stenosis. CT ABDOMEN PELVIS FINDINGS Hepatobiliary: The liver is normal. Status post cholecystectomy.There is no biliary ductal dilation. Pancreas: Normal contours without ductal dilatation. No peripancreatic fluid collection. Spleen: Unremarkable. Adrenals/Urinary Tract: --Adrenal glands: Unremarkable. --Right  kidney/ureter: No hydronephrosis or radiopaque kidney stones. --Left kidney/ureter: No hydronephrosis or radiopaque kidney stones. --Urinary bladder: Bladder is decompressed by Foley catheter Stomach/Bowel: --Stomach/Duodenum: No hiatal hernia or other gastric abnormality. Normal duodenal course and caliber. --Small bowel: Unremarkable. --Colon: There is a large amount of stool in the rectum. --Appendix: Not visualized. No right lower quadrant inflammation or free fluid. Vascular/Lymphatic: Atherosclerotic calcification is present within the non-aneurysmal abdominal aorta, without hemodynamically significant stenosis. --No retroperitoneal lymphadenopathy. --No mesenteric lymphadenopathy. --No pelvic or inguinal lymphadenopathy. Reproductive: Unremarkable Other: No ascites or free air. The abdominal wall is normal. Musculoskeletal. No acute displaced fractures. IMPRESSION: 1. No acute traumatic injury to the chest, abdomen or pelvis. 2. Tree-in-bud airspace opacities bilaterally with a small focus of consolidation in the right lower lobe. These are favored to be infectious or inflammatory in etiology. However, a three-month follow-up CT is recommended to confirm stability or resolution. 3. Large amount of stool in the rectum. Aortic Atherosclerosis (ICD10-I70.0). Electronically Signed   By: Constance Holster M.D.   On: 11/29/2019 17:29   CT CERVICAL SPINE WO CONTRAST  Result Date: 11/29/2019 CLINICAL DATA:  Status post fall. EXAM: CT CERVICAL SPINE WITHOUT CONTRAST TECHNIQUE: Multidetector CT imaging of the cervical spine was performed without intravenous contrast. Multiplanar CT image reconstructions were also generated. COMPARISON:  January 07, 2015 FINDINGS: Alignment: Approximately 2 mm to 3 mm retrolisthesis of the C2 vertebral body is noted on C3. Skull base and vertebrae: No acute fracture. No primary bone lesion or focal pathologic process. Soft tissues and spinal canal: No prevertebral fluid or  swelling. No visible canal hematoma. Disc levels: Moderate severity multilevel endplate sclerosis and anterior osteophyte formation is seen throughout the cervical spine with calcification of the anterior longitudinal ligament. There is marked severity narrowing of the anterior atlantoaxial articulation. Moderate to marked severity multilevel intervertebral disc space narrowing is seen. Marked severity bilateral multilevel facet joint hypertrophy is noted. Upper chest: Negative. Other: None. IMPRESSION: 1. No acute fracture within the cervical spine. 2. Marked severity multilevel degenerative changes. 3. Approximately 2 mm to 3 mm retrolisthesis of the C2 vertebral body on C3. Electronically Signed   By: Virgina Norfolk M.D.   On: 11/29/2019 17:22   DG Chest Port 1 View  Result Date: 11/29/2019 CLINICAL DATA:  Altered mental status EXAM: PORTABLE CHEST 1 VIEW COMPARISON:  Radiograph 11/13/2015 FINDINGS: Chronic hyperinflation with coarsened interstitial changes and likely scarring in the right mid lung. No new consolidative opacity is seen. No pneumothorax or visible effusion. The aorta is calcified. The remaining cardiomediastinal contours are unremarkable. No acute osseous or soft tissue abnormality. Degenerative changes are present in the imaged spine and shoulders. Surgical clips noted in the left axilla. Telemetry leads overlie the chest. IMPRESSION: No acute cardiopulmonary abnormality. Chronic hyperinflation and coarsened interstitial changes. Aortic Atherosclerosis (ICD10-I70.0). Electronically Signed   By: Elwin Sleight.D.  On: 11/29/2019 15:53   CT RENAL STONE STUDY  Result Date: 12/01/2019 CLINICAL DATA:  Hematuria EXAM: CT ABDOMEN AND PELVIS WITHOUT CONTRAST TECHNIQUE: Multidetector CT imaging of the abdomen and pelvis was performed following the standard protocol without oral or IV contrast. COMPARISON:  November 29, 2019; February 01, 2005 FINDINGS: Lower chest: There is a small right  pleural effusion. There is patchy bibasilar atelectasis. There are multiple foci of coronary artery calcification. Hepatobiliary: No focal liver lesions are appreciable on this noncontrast enhanced study. The gallbladder is absent. There is artifact from clips in the gallbladder fossa. No appreciable biliary duct dilatation. Pancreas: There is no appreciable pancreatic mass or inflammatory focus. Spleen: No splenic lesions are evident. Adrenals/Urinary Tract: Adrenals appear unremarkable. Kidneys bilaterally show no evident mass or hydronephrosis on either side. There is no renal or ureteral calculus. The urinary bladder is distended. There is a Foley catheter with balloon within the prostate. The tip of the Foley catheter is at the junction of the prostate and inferior most aspect of the bladder. Stomach/Bowel: The rectum is distended with stool and air. No rectal wall thickening evident. There is moderate stool elsewhere in the colon. There is no appreciable bowel wall thickening. No evident bowel obstruction. The terminal ileum appears normal. There is no appreciable free air or portal venous air. Vascular/Lymphatic: No abdominal aortic aneurysm. There is aortic and iliac artery atherosclerosis. No adenopathy is appreciable in the abdomen or pelvis. Reproductive: The prostate is enlarged. There is loss of fat plane between the prostate and inferior bladder. Appearance suggests that there may be prostatic invasion into the inferior bladder. Seminal vesicles do not appear appreciably enlarged. Other: Appendix reportedly absent. No periappendiceal region inflammation. No evident abscess or ascites in the abdomen or pelvis. Musculoskeletal: There is multifocal degenerative change in the lower thoracic and lumbar spine regions. Prominent nerve root sleeve cysts expanding a portion of the left sacrum, stable from 2006 study. There is 3 mm of anterolisthesis of L5 on S1, felt to be due to underlying spondylosis. No  blastic or lytic bone lesions are evident. There is underlying osteoporosis. No intramuscular or abdominal wall lesions are evident. IMPRESSION: 1. Foley catheter balloon is expanded with in the prostate. Tip of the Foley catheter is at the junction of the prostate and inferior bladder. Note that there is a degree of urinary bladder distension. Adjustment of Foley catheter advised. 2. Enlargement of the prostate with questionable invasion of the prostate into the inferior bladder raises concern for potential prostatic neoplasm. This finding warrants clinical assessment and PSA evaluation. This finding potentially may warrant direct visualization of the urinary bladder. 3. No hydronephrosis. No renal or ureteral calculus on either side. 4. Rectum distended with stool and air. No bowel obstruction evident. No abscess in the abdomen or pelvis. Appendix absent. 5. Aortic Atherosclerosis (ICD10-I70.0). Foci of coronary artery and pelvic arterial vascular calcification noted. 6. Bones osteoporotic. Prominent nerve root sleeve cysts in the left sacrum stable since 2006. These results will be called to the ordering clinician or representative by the Radiologist Assistant, and communication documented in the PACS or [employer]. Electronically Signed   By: Lowella Grip III M.D.   On: 12/01/2019 08:45        Scheduled Meds: . Chlorhexidine Gluconate Cloth  6 each Topical Daily  . enoxaparin (LOVENOX) injection  40 mg Subcutaneous Q24H  . potassium chloride  40 mEq Oral Once  . senna-docusate  1 tablet Oral BID  . sodium chloride  flush  3 mL Intravenous Q12H   Continuous Infusions: . sodium chloride 75 mL/hr at 12/01/19 0954  . cefTRIAXone (ROCEPHIN)  IV Stopped (12/01/19 0747)  . doxycycline (VIBRAMYCIN) IV 100 mg (12/01/19 1249)     LOS: 2 days   Time spent= 35 mins    Lumi Winslett Arsenio Loader, MD [facility]  If 7PM-7AM, please contact night-coverage  12/01/2019, 1:58 PM

## 2019-12-01 NOTE — Progress Notes (Signed)
Nutrition Brief Note  RD received consult to assess pt due to poor po intake. Pt has a PMH of GERD, HTN, and worsening dementia, and was admitted with acute encephalopathy 2/2 CAP.  Chart reviewed. Pt's family have decided that they want to explore hospice options. Home Hospice team has been consulted. Family agrees to comfort feeding and limited intervention. No further nutrition interventions warranted at this time.  Please re-consult as needed.   Larkin Ina, MS, RD, LDN RD pager number and weekend/on-call pager number located in [place].

## 2019-12-02 DIAGNOSIS — N3289 Other specified disorders of bladder: Secondary | ICD-10-CM

## 2019-12-02 DIAGNOSIS — Z515 Encounter for palliative care: Secondary | ICD-10-CM

## 2019-12-02 DIAGNOSIS — G9340 Encephalopathy, unspecified: Secondary | ICD-10-CM | POA: Diagnosis not present

## 2019-12-02 LAB — MAGNESIUM: Magnesium: 1.9 mg/dL (ref 1.7–2.4)

## 2019-12-02 LAB — CBC
HCT: 36.8 % — ABNORMAL LOW (ref 39.0–52.0)
Hemoglobin: 11.9 g/dL — ABNORMAL LOW (ref 13.0–17.0)
MCH: 29.5 pg (ref 26.0–34.0)
MCHC: 32.3 g/dL (ref 30.0–36.0)
MCV: 91.1 fL (ref 80.0–100.0)
Platelets: 210 10*3/uL (ref 150–400)
RBC: 4.04 MIL/uL — ABNORMAL LOW (ref 4.22–5.81)
RDW: 13.4 % (ref 11.5–15.5)
WBC: 9.8 10*3/uL (ref 4.0–10.5)
nRBC: 0 % (ref 0.0–0.2)

## 2019-12-02 LAB — GLUCOSE, CAPILLARY
Glucose-Capillary: 60 mg/dL — ABNORMAL LOW (ref 70–99)
Glucose-Capillary: 162 mg/dL — ABNORMAL HIGH (ref 70–99)

## 2019-12-02 MED ORDER — GLYCOPYRROLATE 0.2 MG/ML IJ SOLN: 0.2000 mg | INTRAMUSCULAR | Status: DC | PRN

## 2019-12-02 MED ORDER — LORAZEPAM 2 MG/ML IJ SOLN
1.0000 mg | INTRAMUSCULAR | Status: DC | PRN
  Filled 2019-12-02: qty 1

## 2019-12-02 MED ORDER — LORAZEPAM 2 MG/ML PO CONC: 1.0000 mg | ORAL | Status: DC | PRN

## 2019-12-02 MED ORDER — HYDRALAZINE HCL 20 MG/ML IJ SOLN
10.0000 mg | INTRAMUSCULAR | Status: DC | PRN
  Administered 2019-12-02: 10 mg via INTRAVENOUS
  Filled 2019-12-02: qty 1

## 2019-12-02 MED ORDER — ONDANSETRON 4 MG PO TBDP: 4.0000 mg | ORAL_TABLET | Freq: Four times a day (QID) | ORAL | Status: DC | PRN

## 2019-12-02 MED ORDER — SENNA 8.6 MG PO TABS: 1.0000 | ORAL_TABLET | Freq: Every evening | ORAL | Status: DC | PRN

## 2019-12-02 MED ORDER — POLYVINYL ALCOHOL 1.4 % OP SOLN
1.0000 [drp] | Freq: Four times a day (QID) | OPHTHALMIC | Status: DC | PRN
  Filled 2019-12-02: qty 15

## 2019-12-02 MED ORDER — ALBUTEROL SULFATE (2.5 MG/3ML) 0.083% IN NEBU: 2.5000 mg | INHALATION_SOLUTION | RESPIRATORY_TRACT | Status: DC | PRN

## 2019-12-02 MED ORDER — MORPHINE SULFATE (CONCENTRATE) 10 MG/0.5ML PO SOLN: 5.0000 mg | ORAL | Status: DC | PRN

## 2019-12-02 MED ORDER — DEXTROSE 50 % IV SOLN
INTRAVENOUS | Status: AC
  Administered 2019-12-02: 50 mL
  Filled 2019-12-02: qty 50

## 2019-12-02 MED ORDER — ONDANSETRON HCL 4 MG/2ML IJ SOLN: 4.0000 mg | Freq: Four times a day (QID) | INTRAMUSCULAR | Status: DC | PRN

## 2019-12-02 MED ORDER — OXYBUTYNIN CHLORIDE 5 MG PO TABS: 2.5000 mg | ORAL_TABLET | Freq: Four times a day (QID) | ORAL | Status: DC | PRN

## 2019-12-02 MED ORDER — MORPHINE SULFATE (PF) 2 MG/ML IV SOLN
1.0000 mg | INTRAVENOUS | Status: DC | PRN
  Administered 2019-12-02 – 2019-12-03 (×3): 1 mg via INTRAVENOUS
  Filled 2019-12-02 (×3): qty 1

## 2019-12-02 MED ORDER — GLYCOPYRROLATE 1 MG PO TABS
1.0000 mg | ORAL_TABLET | ORAL | Status: DC | PRN
  Filled 2019-12-02: qty 1

## 2019-12-02 MED ORDER — LORAZEPAM 1 MG PO TABS: 1.0000 mg | ORAL_TABLET | ORAL | Status: DC | PRN

## 2019-12-02 MED ORDER — MAGIC MOUTHWASH
15.0000 mL | Freq: Four times a day (QID) | ORAL | Status: DC | PRN
  Filled 2019-12-02: qty 15

## 2019-12-02 NOTE — Progress Notes (Signed)
IV ativan 1 mg and morphine 1 mg given to patient at 1208 for agitation and anxiety. Patient is inroom with family at this time resting. Will continue to monitor.

## 2019-12-02 NOTE — TOC Transition Note (Signed)
Transition of Care [facility]) - CM/SW Discharge Note   Patient Details  Name: Thomas Mccarty. MRN: [id number] Date of Birth: Oct 01, 1925  Transition of Care [facility]) CM/SW Contact:  Pollie Friar, RN Phone Number: 12/02/2019, 1:41 PM   Clinical Narrative:         Barriers to Discharge: Hospice Bed not available   Patient Goals and CMS Choice   CMS Medicare.gov Compare Post Acute Care list provided to:: Patient Represenative (must comment) Choice offered to / list presented to : Adult Children  Discharge Placement                       Discharge Plan and Services   Discharge Planning Services: CM Consult Post Acute Care Choice: Hospice                               Social Determinants of Health (SDOH) Interventions     Readmission Risk Interventions No flowsheet data found.

## 2019-12-02 NOTE — Plan of Care (Signed)
  Problem: Pain Managment: Goal: General experience of comfort will improve Outcome: Progressing   Problem: Safety: Goal: Ability to remain free from injury will improve Outcome: Progressing   

## 2019-12-02 NOTE — Progress Notes (Signed)
OT Cancellation Note  Patient Details Name: Thomas Mccarty. MRN: [id number] DOB: [birth year]   Cancelled Treatment:    Reason Eval/Treat Not Completed: Other (comment). Per chart review, pt is now for hospice services. Will sign off.  Tyrone Schimke, OT Acute Rehabilitation Services Pager: [phone] Office: [phone]  12/02/2019, 10:26 AM

## 2019-12-02 NOTE — TOC Progression Note (Signed)
Transition of Care (TOC) - Progression Note    Patient Details  Name: Thomas Mccarty. MRN: [id number] Date of Birth: 09-16-1925  Transition of Care [facility]) CM/SW Contact  Pollie Friar, RN Phone Number: 12/02/2019, 1:42 PM  Clinical Narrative:    [facility] has offered pt a bed for tomorrow. CM has informed the family and arranged transportation for 10:00 am. MD updated.  TOC following.   Expected Discharge Plan: [place] Barriers to Discharge: Hospice Bed not available  Expected Discharge Plan and Services Expected Discharge Plan: [place]   Discharge Planning Services: CM Consult Post Acute Care Choice: Hospice Living arrangements for the past 2 months: Single Family Home                                       Social Determinants of Health (SDOH) Interventions    Readmission Risk Interventions No flowsheet data found.

## 2019-12-02 NOTE — Progress Notes (Signed)
At 1648, patient blood glucose level was 60 mg/dl, admistered dextrose 50% at 1654, Rechecked  blood glucose level at 1732 was 162 mg/dl. MD notified, will continue to monitor.

## 2019-12-02 NOTE — Progress Notes (Signed)
PROGRESS NOTE    Thomas Mccarty.  QVZ:[id number] DOB: January 18, 1926 DOA: 11/29/2019 PCP: Marda Stalker, PA-C   Brief Narrative:  86 year old male with past medical history of GERD, hypertension and likely dementia who lives alone with family close by but has been relatively independent and was brought into the emergency room after being found down by a neighbor on the afternoon of 10/25.  According to family, he has had dementia that is been slowly progressing over the past few years, but has stubbornly maintained his independence.  In the emergency room, patient was found to have a leukocytosis of 12.5, CK level of 2740 lactic acid level of 2.4 and chest x-ray concerning for possible pneumonia.  Although he was found down, he was confused but awake in the emergency room.  He was admitted to the hospitalist service for rhabdomyolysis and treatment of pneumonia.    This morning, procalcitonin level elevated at 0.24.  White blood cell count slightly increased to 13.  Patient still confused but awake.  By afternoon, more agitated requiring doses of Ativan.  Patient himself unable to communicate with me, although he is awake.  After prolonged discussion with the patient's family regarding his care, he has been transitioned to hospice.   Assessment & Plan:   Principal Problem:   Acute encephalopathy Active Problems:   HTN (hypertension)   GERD (gastroesophageal reflux disease)   Hyperlipidemia   CAP (community acquired pneumonia)   Senile dementia, with behavioral disturbance (HCC)   Rhabdomyolysis  Acute metabolic encephalopathy secondary to community-acquired pneumonia -Procalcitonin mildly elevated.  As needed bronchodilators.  Incentive spirometer and flutter valve. -Continue antibiotics-Rocephin and doxycycline.  Lactic acidosis with mild to moderate dehydration Mild rhabdomyolysis -Improved with IV fluids.  Hematuria with enlarged prostate concerns of neoplasm Urinary  retention with Foley catheter in place -Patient to be transitioned to hospice.  Appropriate orders placed  Essential hypertension -Resume home meds  Family chooses patient to be transferred to hospice facility whenever bed becomes available.  DVT prophylaxis:   Code Status: DNR Family Communication: Spoke with patient's daughter Almyra Free over the phone today  Status is: Inpatient  Remains inpatient appropriate because:Inpatient level of care appropriate due to severity of illness   Dispo: The patient is from: Home              Anticipated d/c is to: Home              Anticipated d/c date is: 1 day              Patient currently is not medically stable to d/c.  Patient to be transition to hospice    Body mass index is 17.88 kg/m.      Subjective: Patient laying in the bed moaning and running.  Poor oral intake  Review of Systems Otherwise negative except as per HPI, including: Difficult to obtain  Examination:  Constitutional: Appears chronically ill Respiratory: Bilateral rhonchi at the bases Cardiovascular: Normal sinus rhythm, no rubs Abdomen: Nontender nondistended good bowel sounds Musculoskeletal: No edema noted Skin: No rashes seen Neurologic: Grossly moving all the extremities Psychiatric: Difficult to assess Foley in place with dark urine Objective: Vitals:   12/01/19 2340 12/02/19 0343 12/02/19 0728 12/02/19 1019  BP: 134/61 (!) 162/59 (!) 189/69 (!) 171/68  Pulse: (!) 59 (!) 56 (!) 53   Resp: 19 19 18    Temp: 98.2 F (36.8 C) 97.9 F (36.6 C) 98.4 F (36.9 C)   TempSrc: Oral Oral  SpO2: 97% 94% 95%   Weight:      Height:        Intake/Output Summary (Last 24 hours) at 12/02/2019 1155 Last data filed at 12/02/2019 0130 Gross per 24 hour  Intake 2350 ml  Output 2550 ml  Net -200 ml   Filed Weights   11/29/19 1502 12/01/19 0300  Weight: 65.8 kg 59.8 kg     Data Reviewed:   CBC: Recent Labs  Lab 11/29/19 1510 11/30/19 0255  11/30/19 1733 12/01/19 0409 12/02/19 0337  WBC 12.5* 13.1* 12.4* 13.9* 9.8  HGB 12.9* 11.6* 11.4* 11.9* 11.9*  HCT 39.3 36.1* 34.7* 36.0* 36.8*  MCV 91.8 92.6 92.5 90.0 91.1  PLT 239 210 200 202 580   Basic Metabolic Panel: Recent Labs  Lab 11/29/19 1510 11/30/19 0255 12/01/19 0409 12/02/19 0337  NA 136 137 139  --   K 4.2 3.7 3.2*  --   CL 98 102 108  --   CO2 25 24 17*  --   GLUCOSE 115* 95 86  --   BUN 21 22 24*  --   CREATININE 0.89 0.91 0.90  --   CALCIUM 9.4 8.8* 8.6*  --   MG  --   --   --  1.9   GFR: Estimated Creatinine Clearance: 44.3 mL/min (by C-G formula based on SCr of 0.9 mg/dL). Liver Function Tests: Recent Labs  Lab 11/29/19 1510 11/30/19 0255 12/01/19 0409  AST 74* 94* 110*  ALT 27 30 38  ALKPHOS 69 60 61  BILITOT 1.6* 1.1 1.4*  PROT 7.7 6.4* 6.2*  ALBUMIN 4.2 3.5 3.1*   No results for input(s): LIPASE, AMYLASE in the last 168 hours. No results for input(s): AMMONIA in the last 168 hours. Coagulation Profile: No results for input(s): INR, PROTIME in the last 168 hours. Cardiac Enzymes: Recent Labs  Lab 11/29/19 1510 11/30/19 1733  CKTOTAL 2,740* 2,419*   BNP (last 3 results) No results for input(s): PROBNP in the last 8760 hours. HbA1C: No results for input(s): HGBA1C in the last 72 hours. CBG: Recent Labs  Lab 11/29/19 1521  GLUCAP 107*   Lipid Profile: No results for input(s): CHOL, HDL, LDLCALC, TRIG, CHOLHDL, LDLDIRECT in the last 72 hours. Thyroid Function Tests: Recent Labs    12/01/19 1037  TSH 4.991*   Anemia Panel: No results for input(s): VITAMINB12, FOLATE, FERRITIN, TIBC, IRON, RETICCTPCT in the last 72 hours. Sepsis Labs: Recent Labs  Lab 11/29/19 1522 11/30/19 1008 11/30/19 1733  PROCALCITON  --  0.27 0.27  LATICACIDVEN 2.4*  --  0.9    Recent Results (from the past 240 hour(s))  Resp Panel by RT PCR (RSV, Flu A&B, Covid) - Nasopharyngeal Swab     Status: None   Collection Time: 11/29/19  3:11 PM    Specimen: Nasopharyngeal Swab  Result Value Ref Range Status   SARS Coronavirus 2 by RT PCR NEGATIVE NEGATIVE Final    Comment: (NOTE) SARS-CoV-2 target nucleic acids are NOT DETECTED.  The SARS-CoV-2 RNA is generally detectable in upper respiratoy specimens during the acute phase of infection. The lowest concentration of SARS-CoV-2 viral copies this assay can detect is 131 copies/mL. A negative result does not preclude SARS-Cov-2 infection and should not be used as the sole basis for treatment or other patient management decisions. A negative result may occur with  improper specimen collection/handling, submission of specimen other than nasopharyngeal swab, presence of viral mutation(s) within the areas targeted by this assay, and inadequate number  of viral copies (<131 copies/mL). A negative result must be combined with clinical observations, patient history, and epidemiological information. The expected result is Negative.  Fact Sheet for Patients:  PinkCheek.be  Fact Sheet for Healthcare Providers:  GravelBags.it  This test is no t yet approved or cleared by the [place] FDA and  has been authorized for detection and/or diagnosis of SARS-CoV-2 by FDA under an Emergency Use Authorization (EUA). This EUA will remain  in effect (meaning this test can be used) for the duration of the COVID-19 declaration under Section 564(b)(1) of the Act, 21 U.S.C. section 360bbb-3(b)(1), unless the authorization is terminated or revoked sooner.     Influenza A by PCR NEGATIVE NEGATIVE Final   Influenza B by PCR NEGATIVE NEGATIVE Final    Comment: (NOTE) The Xpert Xpress SARS-CoV-2/FLU/RSV assay is intended as an aid in  the diagnosis of influenza from Nasopharyngeal swab specimens and  should not be used as a sole basis for treatment. Nasal washings and  aspirates are unacceptable for Xpert Xpress SARS-CoV-2/FLU/RSV   testing.  Fact Sheet for Patients: PinkCheek.be  Fact Sheet for Healthcare Providers: GravelBags.it  This test is not yet approved or cleared by the [place] FDA and  has been authorized for detection and/or diagnosis of SARS-CoV-2 by  FDA under an Emergency Use Authorization (EUA). This EUA will remain  in effect (meaning this test can be used) for the duration of the  Covid-19 declaration under Section 564(b)(1) of the Act, 21  U.S.C. section 360bbb-3(b)(1), unless the authorization is  terminated or revoked.    Respiratory Syncytial Virus by PCR NEGATIVE NEGATIVE Final    Comment: (NOTE) Fact Sheet for Patients: PinkCheek.be  Fact Sheet for Healthcare Providers: GravelBags.it  This test is not yet approved or cleared by the [place] FDA and  has been authorized for detection and/or diagnosis of SARS-CoV-2 by  FDA under an Emergency Use Authorization (EUA). This EUA will remain  in effect (meaning this test can be used) for the duration of the  COVID-19 declaration under Section 564(b)(1) of the Act, 21 U.S.C.  section 360bbb-3(b)(1), unless the authorization is terminated or  revoked. Performed at [facility], [place] [address], [place], [place] [address]   Urine culture     Status: None   Collection Time: 11/29/19  4:17 PM   Specimen: Urine, Catheterized  Result Value Ref Range Status   Specimen Description URINE, CATHETERIZED  Final   Special Requests NONE  Final   Culture   Final    NO GROWTH Performed at [facility], [address], [place],  [address]    Report Status 11/30/2019 FINAL  Final         Radiology Studies: CT RENAL STONE STUDY  Result Date: 12/01/2019 CLINICAL DATA:  Hematuria EXAM: CT ABDOMEN AND PELVIS WITHOUT CONTRAST TECHNIQUE: Multidetector CT imaging of the abdomen and pelvis was performed  following the standard protocol without oral or IV contrast. COMPARISON:  November 29, 2019; February 01, 2005 FINDINGS: Lower chest: There is a small right pleural effusion. There is patchy bibasilar atelectasis. There are multiple foci of coronary artery calcification. Hepatobiliary: No focal liver lesions are appreciable on this noncontrast enhanced study. The gallbladder is absent. There is artifact from clips in the gallbladder fossa. No appreciable biliary duct dilatation. Pancreas: There is no appreciable pancreatic mass or inflammatory focus. Spleen: No splenic lesions are evident. Adrenals/Urinary Tract: Adrenals appear unremarkable. Kidneys bilaterally show no evident mass or  hydronephrosis on either side. There is no renal or ureteral calculus. The urinary bladder is distended. There is a Foley catheter with balloon within the prostate. The tip of the Foley catheter is at the junction of the prostate and inferior most aspect of the bladder. Stomach/Bowel: The rectum is distended with stool and air. No rectal wall thickening evident. There is moderate stool elsewhere in the colon. There is no appreciable bowel wall thickening. No evident bowel obstruction. The terminal ileum appears normal. There is no appreciable free air or portal venous air. Vascular/Lymphatic: No abdominal aortic aneurysm. There is aortic and iliac artery atherosclerosis. No adenopathy is appreciable in the abdomen or pelvis. Reproductive: The prostate is enlarged. There is loss of fat plane between the prostate and inferior bladder. Appearance suggests that there may be prostatic invasion into the inferior bladder. Seminal vesicles do not appear appreciably enlarged. Other: Appendix reportedly absent. No periappendiceal region inflammation. No evident abscess or ascites in the abdomen or pelvis. Musculoskeletal: There is multifocal degenerative change in the lower thoracic and lumbar spine regions. Prominent nerve root sleeve cysts  expanding a portion of the left sacrum, stable from 2006 study. There is 3 mm of anterolisthesis of L5 on S1, felt to be due to underlying spondylosis. No blastic or lytic bone lesions are evident. There is underlying osteoporosis. No intramuscular or abdominal wall lesions are evident. IMPRESSION: 1. Foley catheter balloon is expanded with in the prostate. Tip of the Foley catheter is at the junction of the prostate and inferior bladder. Note that there is a degree of urinary bladder distension. Adjustment of Foley catheter advised. 2. Enlargement of the prostate with questionable invasion of the prostate into the inferior bladder raises concern for potential prostatic neoplasm. This finding warrants clinical assessment and PSA evaluation. This finding potentially may warrant direct visualization of the urinary bladder. 3. No hydronephrosis. No renal or ureteral calculus on either side. 4. Rectum distended with stool and air. No bowel obstruction evident. No abscess in the abdomen or pelvis. Appendix absent. 5. Aortic Atherosclerosis (ICD10-I70.0). Foci of coronary artery and pelvic arterial vascular calcification noted. 6. Bones osteoporotic. Prominent nerve root sleeve cysts in the left sacrum stable since 2006. These results will be called to the ordering clinician or representative by the Radiologist Assistant, and communication documented in the PACS or [employer]. Electronically Signed   By: Lowella Grip III M.D.   On: 12/01/2019 08:45        Scheduled Meds: . Chlorhexidine Gluconate Cloth  6 each Topical Daily  . potassium chloride  40 mEq Oral Once  . senna-docusate  1 tablet Oral BID  . sodium chloride flush  3 mL Intravenous Q12H   Continuous Infusions: . sodium chloride 75 mL/hr at 12/01/19 2005  . cefTRIAXone (ROCEPHIN)  IV Stopped (12/01/19 1811)  . doxycycline (VIBRAMYCIN) IV 100 mg (12/02/19 1027)     LOS: 3 days   Time spent= 15 mins    Lety Cullens Arsenio Loader,  MD [facility]  If 7PM-7AM, please contact night-coverage  12/02/2019, 11:55 AM

## 2019-12-03 ENCOUNTER — Inpatient Hospital Stay (HOSPITAL_COMMUNITY): Payer: Medicare Other

## 2019-12-03 DIAGNOSIS — G9340 Encephalopathy, unspecified: Secondary | ICD-10-CM | POA: Diagnosis not present

## 2019-12-03 LAB — GLUCOSE, CAPILLARY: Glucose-Capillary: 104 mg/dL — ABNORMAL HIGH (ref 70–99)

## 2019-12-03 MED ORDER — LORAZEPAM 1 MG PO TABS: 1.0000 mg | ORAL_TABLET | ORAL | 0 refills | Status: AC | PRN

## 2019-12-03 MED ORDER — MORPHINE SULFATE (CONCENTRATE) 10 MG/0.5ML PO SOLN: 5.0000 mg | ORAL | 0 refills | Status: AC | PRN

## 2019-12-03 MED ORDER — FUROSEMIDE 10 MG/ML IJ SOLN
40.0000 mg | Freq: Once | INTRAMUSCULAR | Status: AC
  Administered 2019-12-03: 40 mg via INTRAVENOUS
  Filled 2019-12-03: qty 4

## 2019-12-03 MED ORDER — POLYETHYLENE GLYCOL 3350 17 G PO PACK: 17.0000 g | PACK | Freq: Every day | ORAL | Status: DC | PRN

## 2019-12-03 NOTE — Discharge Summary (Signed)
Physician Discharge Summary  Thomas Mccarty. TTS:[id number] DOB: August 09, 1925 DOA: 11/29/2019  PCP: Thomas Stalker, PA-C  Admit date: 11/29/2019 Discharge date: 12/03/2019  Admitted From: Home Disposition: Hospice  Recommendations for Outpatient Follow-up:  1. Follow up with PCP in 1-2 weeks   Discharge Condition: Stable CODE STATUS: DNR Diet recommendation: Comfort feeding  Brief/Interim Summary: 86 year old male with past medical history of GERD, hypertension and likely dementia who lives alone with family close by but has been relatively independent and was brought into the emergency room after being found down by a neighbor on the afternoon of 10/25. According to family, he has had dementia that is been slowly progressing over the past few years, but has stubbornly maintained his independence. In the emergency room, patient was found to have a leukocytosis of 12.5, CK level of 2740 lactic acid level of 2.4 and chest x-ray concerning for possible pneumonia. Although he was found down, he was confused but awake in the emergency room. He was admitted to the hospitalist service for rhabdomyolysis and treatment of pneumonia.   This morning, procalcitonin level elevated at 0.24. White blood cell count slightly increased to 13. Patient still confused but awake. By afternoon, more agitated requiring doses of Ativan. Patient himself unable to communicate with me, although he is awake.  After prolonged discussion with the patient's family regarding his care, he has been transitioned to hospice.   Acute metabolic encephalopathy secondary to community-acquired pneumonia -Procalcitonin mildly elevated.  As needed bronchodilators.  Incentive spirometer and flutter valve. -We will stop antibiotics upon discharge  Lactic acidosis with mild to moderate dehydration Mild rhabdomyolysis -Improved with fluids  Hematuria with enlarged prostate concerns of neoplasm Urinary retention with  Foley catheter in place -Patient to be transitioned to hospice.  Appropriate orders placed  Essential hypertension -Resume home meds  Family chooses patient to be transferred to hospice facility whenever bed becomes available.  This will likely happen today.   Body mass index is 17.88 kg/m.         Discharge Diagnoses:  Principal Problem:   Acute encephalopathy Active Problems:   HTN (hypertension)   GERD (gastroesophageal reflux disease)   Hyperlipidemia   Melanoma of back ([place])   CAP (community acquired pneumonia)   Senile dementia, with behavioral disturbance ([name])   Rhabdomyolysis   Bladder mass   Hospice care patient    Subjective: Unable to participate in proper conversation.  No obvious distress  Discharge Exam: Vitals:   12/02/19 1158 12/02/19 2004  BP: (!) 159/84 (!) 168/52  Pulse: 80 (!) 52  Resp: 20 20  Temp: 98.4 F (36.9 C) 98.4 F (36.9 C)  SpO2:  97%   Vitals:   12/02/19 0728 12/02/19 1019 12/02/19 1158 12/02/19 2004  BP: (!) 189/69 (!) 171/68 (!) 159/84 (!) 168/52  Pulse: (!) 53  80 (!) 52  Resp: 18  20 20   Temp: 98.4 F (36.9 C)  98.4 F (36.9 C) 98.4 F (36.9 C)  TempSrc:    Oral  SpO2: 95%   97%  Weight:      Height:        General: Appears chronically ill Cardiovascular: RRR, S1/S2 +, no rubs, no gallops Respiratory: Diminished breath sounds at the bases Abdominal: Soft, NT, ND, bowel sounds + Extremities: no edema, no cyanosis Foley in place Discharge Instructions   Allergies as of 12/03/2019      Reactions   Codeine       Medication List    STOP  taking these medications   ondansetron 4 MG tablet Commonly known as: Zofran     TAKE these medications   LORazepam 1 MG tablet Commonly known as: ATIVAN Take 1 tablet (1 mg total) by mouth every 4 (four) hours as needed for anxiety.   morphine CONCENTRATE 10 MG/0.5ML Soln concentrated solution Place 0.25 mLs (5 mg total) under the tongue every 2 (two) hours as  needed for moderate pain (or dyspnea).       Follow-up Information    Thomas Stalker, PA-C. Schedule an appointment as soon as possible for a visit in 1 week(s).   Specialty: Family Medicine Contact information: [place] [address] [phone]              Allergies  Allergen Reactions  . Codeine     You were cared for by a hospitalist during your hospital stay. If you have any questions about your discharge medications or the care you received while you were in the hospital after you are discharged, you can call the unit and asked to speak with the hospitalist on call if the hospitalist that took care of you is not available. Once you are discharged, your primary care physician will handle any further medical issues. Please note that no refills for any discharge medications will be authorized once you are discharged, as it is imperative that you return to your primary care physician (or establish a relationship with a primary care physician if you do not have one) for your aftercare needs so that they can reassess your need for medications and monitor your lab values.   Procedures/Studies: CT ABDOMEN PELVIS WO CONTRAST  Result Date: 11/29/2019 CLINICAL DATA:  Acute pain due to trauma. EXAM: CT CHEST, ABDOMEN AND PELVIS WITHOUT CONTRAST TECHNIQUE: Multidetector CT imaging of the chest, abdomen and pelvis was performed following the standard protocol without IV contrast. COMPARISON:  None. FINDINGS: CT CHEST FINDINGS Cardiovascular: The heart size is normal. There are coronary artery calcifications. There are atherosclerotic changes of the thoracic aorta. There is no significant pericardial effusion. Mediastinum/Nodes: -- No mediastinal lymphadenopathy. -- No hilar lymphadenopathy. -- No axillary lymphadenopathy. -- No supraclavicular lymphadenopathy. -- Normal thyroid gland where visualized. -  Unremarkable esophagus. Lungs/Pleura: There are few tree-in-bud  airspace opacities in the right upper lobe. There is a focal area of consolidation involving the right lower lobe measuring approximately 1 cm (axial series 4, image 102). Small nodules are noted in the left lower lobe and lingula. There is no pneumothorax. No large pleural effusion. There is debris within the trachea and right mainstem bronchus. Musculoskeletal: No chest wall abnormality. No bony spinal canal stenosis. CT ABDOMEN PELVIS FINDINGS Hepatobiliary: The liver is normal. Status post cholecystectomy.There is no biliary ductal dilation. Pancreas: Normal contours without ductal dilatation. No peripancreatic fluid collection. Spleen: Unremarkable. Adrenals/Urinary Tract: --Adrenal glands: Unremarkable. --Right kidney/ureter: No hydronephrosis or radiopaque kidney stones. --Left kidney/ureter: No hydronephrosis or radiopaque kidney stones. --Urinary bladder: Bladder is decompressed by Foley catheter Stomach/Bowel: --Stomach/Duodenum: No hiatal hernia or other gastric abnormality. Normal duodenal course and caliber. --Small bowel: Unremarkable. --Colon: There is a large amount of stool in the rectum. --Appendix: Not visualized. No right lower quadrant inflammation or free fluid. Vascular/Lymphatic: Atherosclerotic calcification is present within the non-aneurysmal abdominal aorta, without hemodynamically significant stenosis. --No retroperitoneal lymphadenopathy. --No mesenteric lymphadenopathy. --No pelvic or inguinal lymphadenopathy. Reproductive: Unremarkable Other: No ascites or free air. The abdominal wall is normal. Musculoskeletal. No acute displaced fractures. IMPRESSION: 1. No acute  traumatic injury to the chest, abdomen or pelvis. 2. Tree-in-bud airspace opacities bilaterally with a small focus of consolidation in the right lower lobe. These are favored to be infectious or inflammatory in etiology. However, a three-month follow-up CT is recommended to confirm stability or resolution. 3. Large amount  of stool in the rectum. Aortic Atherosclerosis (ICD10-I70.0). Electronically Signed   By: Constance Holster M.D.   On: 11/29/2019 17:29   DG Chest 1 View  Result Date: 12/03/2019 CLINICAL DATA:  Dyspnea. EXAM: CHEST  1 VIEW COMPARISON:  November 29, 2019 FINDINGS: The lungs are hyperinflated. Chronic appearing interstitial lung markings are again seen. Mild biapical pleural thickening is noted. There is no evidence of acute infiltrate, pleural effusion or pneumothorax. The heart size and mediastinal contours are within normal limits. Multilevel degenerative changes seen throughout the thoracic spine. IMPRESSION: COPD without evidence of acute or active cardiopulmonary disease. Electronically Signed   By: Virgina Norfolk M.D.   On: 12/03/2019 03:12   CT HEAD WO CONTRAST  Result Date: 11/29/2019 CLINICAL DATA:  Altered mental status, found down in his driveway. EXAM: CT HEAD WITHOUT CONTRAST TECHNIQUE: Contiguous axial images were obtained from the base of the skull through the vertex without intravenous contrast. COMPARISON:  January 25, 2011 FINDINGS: Brain: There is mild cerebral atrophy with widening of the extra-axial spaces and ventricular dilatation. There are areas of decreased attenuation within the white matter tracts of the supratentorial brain, consistent with microvascular disease changes. Vascular: No hyperdense vessel or unexpected calcification. Skull: Normal. Negative for fracture or focal lesion. Sinuses/Orbits: No acute finding. Other: Mild scalp soft tissue swelling is seen along the posterior aspect of the vertex on the left, with an associated 2.6 cm x 0.7 cm scalp soft tissue mass. IMPRESSION: 1. Mild scalp soft tissue swelling, and associated soft tissue mass, along the posterior aspect of the vertex on the left. Correlation with physical examination and follow-up MRI is recommended to exclude an underlying neoplasm. 2. No acute intracranial abnormality. Electronically Signed   By:  Virgina Norfolk M.D.   On: 11/29/2019 18:57   CT Chest Wo Contrast  Result Date: 11/29/2019 CLINICAL DATA:  Acute pain due to trauma. EXAM: CT CHEST, ABDOMEN AND PELVIS WITHOUT CONTRAST TECHNIQUE: Multidetector CT imaging of the chest, abdomen and pelvis was performed following the standard protocol without IV contrast. COMPARISON:  None. FINDINGS: CT CHEST FINDINGS Cardiovascular: The heart size is normal. There are coronary artery calcifications. There are atherosclerotic changes of the thoracic aorta. There is no significant pericardial effusion. Mediastinum/Nodes: -- No mediastinal lymphadenopathy. -- No hilar lymphadenopathy. -- No axillary lymphadenopathy. -- No supraclavicular lymphadenopathy. -- Normal thyroid gland where visualized. -  Unremarkable esophagus. Lungs/Pleura: There are few tree-in-bud airspace opacities in the right upper lobe. There is a focal area of consolidation involving the right lower lobe measuring approximately 1 cm (axial series 4, image 102). Small nodules are noted in the left lower lobe and lingula. There is no pneumothorax. No large pleural effusion. There is debris within the trachea and right mainstem bronchus. Musculoskeletal: No chest wall abnormality. No bony spinal canal stenosis. CT ABDOMEN PELVIS FINDINGS Hepatobiliary: The liver is normal. Status post cholecystectomy.There is no biliary ductal dilation. Pancreas: Normal contours without ductal dilatation. No peripancreatic fluid collection. Spleen: Unremarkable. Adrenals/Urinary Tract: --Adrenal glands: Unremarkable. --Right kidney/ureter: No hydronephrosis or radiopaque kidney stones. --Left kidney/ureter: No hydronephrosis or radiopaque kidney stones. --Urinary bladder: Bladder is decompressed by Foley catheter Stomach/Bowel: --Stomach/Duodenum: No hiatal hernia or other gastric  abnormality. Normal duodenal course and caliber. --Small bowel: Unremarkable. --Colon: There is a large amount of stool in the rectum.  --Appendix: Not visualized. No right lower quadrant inflammation or free fluid. Vascular/Lymphatic: Atherosclerotic calcification is present within the non-aneurysmal abdominal aorta, without hemodynamically significant stenosis. --No retroperitoneal lymphadenopathy. --No mesenteric lymphadenopathy. --No pelvic or inguinal lymphadenopathy. Reproductive: Unremarkable Other: No ascites or free air. The abdominal wall is normal. Musculoskeletal. No acute displaced fractures. IMPRESSION: 1. No acute traumatic injury to the chest, abdomen or pelvis. 2. Tree-in-bud airspace opacities bilaterally with a small focus of consolidation in the right lower lobe. These are favored to be infectious or inflammatory in etiology. However, a three-month follow-up CT is recommended to confirm stability or resolution. 3. Large amount of stool in the rectum. Aortic Atherosclerosis (ICD10-I70.0). Electronically Signed   By: Constance Holster M.D.   On: 11/29/2019 17:29   CT CERVICAL SPINE WO CONTRAST  Result Date: 11/29/2019 CLINICAL DATA:  Status post fall. EXAM: CT CERVICAL SPINE WITHOUT CONTRAST TECHNIQUE: Multidetector CT imaging of the cervical spine was performed without intravenous contrast. Multiplanar CT image reconstructions were also generated. COMPARISON:  January 07, 2015 FINDINGS: Alignment: Approximately 2 mm to 3 mm retrolisthesis of the C2 vertebral body is noted on C3. Skull base and vertebrae: No acute fracture. No primary bone lesion or focal pathologic process. Soft tissues and spinal canal: No prevertebral fluid or swelling. No visible canal hematoma. Disc levels: Moderate severity multilevel endplate sclerosis and anterior osteophyte formation is seen throughout the cervical spine with calcification of the anterior longitudinal ligament. There is marked severity narrowing of the anterior atlantoaxial articulation. Moderate to marked severity multilevel intervertebral disc space narrowing is seen. Marked  severity bilateral multilevel facet joint hypertrophy is noted. Upper chest: Negative. Other: None. IMPRESSION: 1. No acute fracture within the cervical spine. 2. Marked severity multilevel degenerative changes. 3. Approximately 2 mm to 3 mm retrolisthesis of the C2 vertebral body on C3. Electronically Signed   By: Virgina Norfolk M.D.   On: 11/29/2019 17:22   DG Chest Port 1 View  Result Date: 11/29/2019 CLINICAL DATA:  Altered mental status EXAM: PORTABLE CHEST 1 VIEW COMPARISON:  Radiograph 11/13/2015 FINDINGS: Chronic hyperinflation with coarsened interstitial changes and likely scarring in the right mid lung. No new consolidative opacity is seen. No pneumothorax or visible effusion. The aorta is calcified. The remaining cardiomediastinal contours are unremarkable. No acute osseous or soft tissue abnormality. Degenerative changes are present in the imaged spine and shoulders. Surgical clips noted in the left axilla. Telemetry leads overlie the chest. IMPRESSION: No acute cardiopulmonary abnormality. Chronic hyperinflation and coarsened interstitial changes. Aortic Atherosclerosis (ICD10-I70.0). Electronically Signed   By: Lovena Le M.D.   On: 11/29/2019 15:53   CT RENAL STONE STUDY  Result Date: 12/01/2019 CLINICAL DATA:  Hematuria EXAM: CT ABDOMEN AND PELVIS WITHOUT CONTRAST TECHNIQUE: Multidetector CT imaging of the abdomen and pelvis was performed following the standard protocol without oral or IV contrast. COMPARISON:  November 29, 2019; February 01, 2005 FINDINGS: Lower chest: There is a small right pleural effusion. There is patchy bibasilar atelectasis. There are multiple foci of coronary artery calcification. Hepatobiliary: No focal liver lesions are appreciable on this noncontrast enhanced study. The gallbladder is absent. There is artifact from clips in the gallbladder fossa. No appreciable biliary duct dilatation. Pancreas: There is no appreciable pancreatic mass or inflammatory focus.  Spleen: No splenic lesions are evident. Adrenals/Urinary Tract: Adrenals appear unremarkable. Kidneys bilaterally show no evident mass or hydronephrosis  on either side. There is no renal or ureteral calculus. The urinary bladder is distended. There is a Foley catheter with balloon within the prostate. The tip of the Foley catheter is at the junction of the prostate and inferior most aspect of the bladder. Stomach/Bowel: The rectum is distended with stool and air. No rectal wall thickening evident. There is moderate stool elsewhere in the colon. There is no appreciable bowel wall thickening. No evident bowel obstruction. The terminal ileum appears normal. There is no appreciable free air or portal venous air. Vascular/Lymphatic: No abdominal aortic aneurysm. There is aortic and iliac artery atherosclerosis. No adenopathy is appreciable in the abdomen or pelvis. Reproductive: The prostate is enlarged. There is loss of fat plane between the prostate and inferior bladder. Appearance suggests that there may be prostatic invasion into the inferior bladder. Seminal vesicles do not appear appreciably enlarged. Other: Appendix reportedly absent. No periappendiceal region inflammation. No evident abscess or ascites in the abdomen or pelvis. Musculoskeletal: There is multifocal degenerative change in the lower thoracic and lumbar spine regions. Prominent nerve root sleeve cysts expanding a portion of the left sacrum, stable from 2006 study. There is 3 mm of anterolisthesis of L5 on S1, felt to be due to underlying spondylosis. No blastic or lytic bone lesions are evident. There is underlying osteoporosis. No intramuscular or abdominal wall lesions are evident. IMPRESSION: 1. Foley catheter balloon is expanded with in the prostate. Tip of the Foley catheter is at the junction of the prostate and inferior bladder. Note that there is a degree of urinary bladder distension. Adjustment of Foley catheter advised. 2. Enlargement of  the prostate with questionable invasion of the prostate into the inferior bladder raises concern for potential prostatic neoplasm. This finding warrants clinical assessment and PSA evaluation. This finding potentially may warrant direct visualization of the urinary bladder. 3. No hydronephrosis. No renal or ureteral calculus on either side. 4. Rectum distended with stool and air. No bowel obstruction evident. No abscess in the abdomen or pelvis. Appendix absent. 5. Aortic Atherosclerosis (ICD10-I70.0). Foci of coronary artery and pelvic arterial vascular calcification noted. 6. Bones osteoporotic. Prominent nerve root sleeve cysts in the left sacrum stable since 2006. These results will be called to the ordering clinician or representative by the Radiologist Assistant, and communication documented in the PACS or [employer]. Electronically Signed   By: Lowella Grip III M.D.   On: 12/01/2019 08:45      The results of significant diagnostics from this hospitalization (including imaging, microbiology, ancillary and laboratory) are listed below for reference.     Microbiology: Recent Results (from the past 240 hour(s))  Resp Panel by RT PCR (RSV, Flu A&B, Covid) - Nasopharyngeal Swab     Status: None   Collection Time: 11/29/19  3:11 PM   Specimen: Nasopharyngeal Swab  Result Value Ref Range Status   SARS Coronavirus 2 by RT PCR NEGATIVE NEGATIVE Final    Comment: (NOTE) SARS-CoV-2 target nucleic acids are NOT DETECTED.  The SARS-CoV-2 RNA is generally detectable in upper respiratoy specimens during the acute phase of infection. The lowest concentration of SARS-CoV-2 viral copies this assay can detect is 131 copies/mL. A negative result does not preclude SARS-Cov-2 infection and should not be used as the sole basis for treatment or other patient management decisions. A negative result may occur with  improper specimen collection/handling, submission of specimen other than  nasopharyngeal swab, presence of viral mutation(s) within the areas targeted by this assay, and inadequate number  of viral copies (<131 copies/mL). A negative result must be combined with clinical observations, patient history, and epidemiological information. The expected result is Negative.  Fact Sheet for Patients:  PinkCheek.be  Fact Sheet for Healthcare Providers:  GravelBags.it  This test is no t yet approved or cleared by the [place] FDA and  has been authorized for detection and/or diagnosis of SARS-CoV-2 by FDA under an Emergency Use Authorization (EUA). This EUA will remain  in effect (meaning this test can be used) for the duration of the COVID-19 declaration under Section 564(b)(1) of the Act, 21 U.S.C. section 360bbb-3(b)(1), unless the authorization is terminated or revoked sooner.     Influenza A by PCR NEGATIVE NEGATIVE Final   Influenza B by PCR NEGATIVE NEGATIVE Final    Comment: (NOTE) The Xpert Xpress SARS-CoV-2/FLU/RSV assay is intended as an aid in  the diagnosis of influenza from Nasopharyngeal swab specimens and  should not be used as a sole basis for treatment. Nasal washings and  aspirates are unacceptable for Xpert Xpress SARS-CoV-2/FLU/RSV  testing.  Fact Sheet for Patients: PinkCheek.be  Fact Sheet for Healthcare Providers: GravelBags.it  This test is not yet approved or cleared by the [place] FDA and  has been authorized for detection and/or diagnosis of SARS-CoV-2 by  FDA under an Emergency Use Authorization (EUA). This EUA will remain  in effect (meaning this test can be used) for the duration of the  Covid-19 declaration under Section 564(b)(1) of the Act, 21  U.S.C. section 360bbb-3(b)(1), unless the authorization is  terminated or revoked.    Respiratory Syncytial Virus by PCR NEGATIVE NEGATIVE Final    Comment:  (NOTE) Fact Sheet for Patients: PinkCheek.be  Fact Sheet for Healthcare Providers: GravelBags.it  This test is not yet approved or cleared by the [place] FDA and  has been authorized for detection and/or diagnosis of SARS-CoV-2 by  FDA under an Emergency Use Authorization (EUA). This EUA will remain  in effect (meaning this test can be used) for the duration of the  COVID-19 declaration under Section 564(b)(1) of the Act, 21 U.S.C.  section 360bbb-3(b)(1), unless the authorization is terminated or  revoked. Performed at [facility], [place] [address] [place], [place] [address]   Urine culture     Status: None   Collection Time: 11/29/19  4:17 PM   Specimen: Urine, Catheterized  Result Value Ref Range Status   Specimen Description URINE, CATHETERIZED  Final   Special Requests NONE  Final   Culture   Final    NO GROWTH Performed at [facility], [address] [place], [place] [address]    Report Status 11/30/2019 FINAL  Final     Labs: BNP (last 3 results) No results for input(s): BNP in the last 8760 hours. Basic Metabolic Panel: Recent Labs  Lab 11/29/19 1510 11/30/19 0255 12/01/19 0409 12/02/19 0337  NA 136 137 139  --   K 4.2 3.7 3.2*  --   CL 98 102 108  --   CO2 25 24 17*  --   GLUCOSE 115* 95 86  --   BUN 21 22 24*  --   CREATININE 0.89 0.91 0.90  --   CALCIUM 9.4 8.8* 8.6*  --   MG  --   --   --  1.9   Liver Function Tests: Recent Labs  Lab 11/29/19 1510 11/30/19 0255 12/01/19 0409  AST 74* 94* 110*  ALT 27 30 38  ALKPHOS 69 60  61  BILITOT 1.6* 1.1 1.4*  PROT 7.7 6.4* 6.2*  ALBUMIN 4.2 3.5 3.1*   No results for input(s): LIPASE, AMYLASE in the last 168 hours. No results for input(s): AMMONIA in the last 168 hours. CBC: Recent Labs  Lab 11/29/19 1510 11/30/19 0255 11/30/19 1733 12/01/19 0409 12/02/19 0337  WBC 12.5* 13.1* 12.4* 13.9* 9.8  HGB 12.9* 11.6* 11.4*  11.9* 11.9*  HCT 39.3 36.1* 34.7* 36.0* 36.8*  MCV 91.8 92.6 92.5 90.0 91.1  PLT 239 210 200 202 210   Cardiac Enzymes: Recent Labs  Lab 11/29/19 1510 11/30/19 1733  CKTOTAL 2,740* 2,419*   BNP: Invalid input(s): POCBNP CBG: Recent Labs  Lab 11/29/19 1521 12/02/19 1648 12/02/19 1732  GLUCAP 107* 60* 162*   D-Dimer No results for input(s): DDIMER in the last 72 hours. Hgb A1c No results for input(s): HGBA1C in the last 72 hours. Lipid Profile No results for input(s): CHOL, HDL, LDLCALC, TRIG, CHOLHDL, LDLDIRECT in the last 72 hours. Thyroid function studies Recent Labs    12/01/19 1037  TSH 4.991*   Anemia work up No results for input(s): VITAMINB12, FOLATE, FERRITIN, TIBC, IRON, RETICCTPCT in the last 72 hours. Urinalysis    Component Value Date/Time   COLORURINE YELLOW 11/29/2019 [place] 11/29/2019 1615   LABSPEC 1.015 11/29/2019 1615   PHURINE 6.0 11/29/2019 1615   GLUCOSEU NEGATIVE 11/29/2019 1615   HGBUR NEGATIVE 11/29/2019 1615   BILIRUBINUR NEGATIVE 11/29/2019 1615   KETONESUR 5 (A) 11/29/2019 1615   PROTEINUR NEGATIVE 11/29/2019 1615   UROBILINOGEN 0.2 03/18/2011 1149   NITRITE NEGATIVE 11/29/2019 1615   LEUKOCYTESUR NEGATIVE 11/29/2019 1615   [facility] Invalid input(s): PROCALCITONIN,  WBC,  LACTICIDVEN Microbiology Recent Results (from the past 240 hour(s))  Resp Panel by RT PCR (RSV, Flu A&B, Covid) - Nasopharyngeal Swab     Status: None   Collection Time: 11/29/19  3:11 PM   Specimen: Nasopharyngeal Swab  Result Value Ref Range Status   SARS Coronavirus 2 by RT PCR NEGATIVE NEGATIVE Final    Comment: (NOTE) SARS-CoV-2 target nucleic acids are NOT DETECTED.  The SARS-CoV-2 RNA is generally detectable in upper respiratoy specimens during the acute phase of infection. The lowest concentration of SARS-CoV-2 viral copies this assay can detect is 131 copies/mL. A negative result does not preclude SARS-Cov-2 infection and  should not be used as the sole basis for treatment or other patient management decisions. A negative result may occur with  improper specimen collection/handling, submission of specimen other than nasopharyngeal swab, presence of viral mutation(s) within the areas targeted by this assay, and inadequate number of viral copies (<131 copies/mL). A negative result must be combined with clinical observations, patient history, and epidemiological information. The expected result is Negative.  Fact Sheet for Patients:  PinkCheek.be  Fact Sheet for Healthcare Providers:  GravelBags.it  This test is no t yet approved or cleared by the [place] FDA and  has been authorized for detection and/or diagnosis of SARS-CoV-2 by FDA under an Emergency Use Authorization (EUA). This EUA will remain  in effect (meaning this test can be used) for the duration of the COVID-19 declaration under Section 564(b)(1) of the Act, 21 U.S.C. section 360bbb-3(b)(1), unless the authorization is terminated or revoked sooner.     Influenza A by PCR NEGATIVE NEGATIVE Final   Influenza B by PCR NEGATIVE NEGATIVE Final    Comment: (NOTE) The Xpert Xpress SARS-CoV-2/FLU/RSV assay is intended as an aid in  the diagnosis  of influenza from Nasopharyngeal swab specimens and  should not be used as a sole basis for treatment. Nasal washings and  aspirates are unacceptable for Xpert Xpress SARS-CoV-2/FLU/RSV  testing.  Fact Sheet for Patients: PinkCheek.be  Fact Sheet for Healthcare Providers: GravelBags.it  This test is not yet approved or cleared by the [place] FDA and  has been authorized for detection and/or diagnosis of SARS-CoV-2 by  FDA under an Emergency Use Authorization (EUA). This EUA will remain  in effect (meaning this test can be used) for the duration of the  Covid-19 declaration  under Section 564(b)(1) of the Act, 21  U.S.C. section 360bbb-3(b)(1), unless the authorization is  terminated or revoked.    Respiratory Syncytial Virus by PCR NEGATIVE NEGATIVE Final    Comment: (NOTE) Fact Sheet for Patients: PinkCheek.be  Fact Sheet for Healthcare Providers: GravelBags.it  This test is not yet approved or cleared by the [place] FDA and  has been authorized for detection and/or diagnosis of SARS-CoV-2 by  FDA under an Emergency Use Authorization (EUA). This EUA will remain  in effect (meaning this test can be used) for the duration of the  COVID-19 declaration under Section 564(b)(1) of the Act, 21 U.S.C.  section 360bbb-3(b)(1), unless the authorization is terminated or  revoked. Performed at [facility], [place] [address], [place], [place] [address]   Urine culture     Status: None   Collection Time: 11/29/19  4:17 PM   Specimen: Urine, Catheterized  Result Value Ref Range Status   Specimen Description URINE, CATHETERIZED  Final   Special Requests NONE  Final   Culture   Final    NO GROWTH Performed at [facility], [address] [place], [place] [address]    Report Status 11/30/2019 FINAL  Final     Time coordinating discharge:  I have spent 35 minutes face to face with the patient and on the ward discussing the patients care, assessment, plan and disposition with other care givers. >50% of the time was devoted counseling the patient about the risks and benefits of treatment/Discharge disposition and coordinating care.   SIGNED:   Damita Lack, MD  [facility] 12/03/2019, 7:43 AM   If 7PM-7AM, please contact night-coverage

## 2019-12-03 NOTE — TOC Transition Note (Signed)
Transition of Care [facility]) - CM/SW Discharge Note   Patient Details  Name: Thomas Mccarty. MRN: [id number] Date of Birth: 01/08/1926  [facility]) CM/SW Contact:  Pollie Friar, RN Phone Number: 12/03/2019, 8:45 AM   Clinical Narrative:    Pt discharging to [place] this am. CM has spoken to Tom at the [facility] and they have bed ready. CM will update pts daughter this am again.  Bedside RN aware of 10 am transport via [facility]. D/c packet at the desk.  Number for report: [phone] and ask for [name]   Final next level of care: Hospice Medical Facility Barriers to Discharge: No Barriers Identified   Patient Goals and CMS Choice   CMS Medicare.gov Compare Post Acute Care list provided to:: Patient Represenative (must comment) Choice offered to / list presented to : Adult Children  Discharge Placement                       Discharge Plan and Services   Discharge Planning Services: CM Consult Post Acute Care Choice: Hospice                               Social Determinants of Health (SDOH) Interventions     Readmission Risk Interventions No flowsheet data found.

## 2019-12-03 NOTE — Plan of Care (Signed)
°  Problem: Education: Goal: Knowledge of General Education information will improve Description: Including pain rating scale, medication(s)/side effects and non-pharmacologic comfort measures Outcome: Adequate for Discharge   Problem: Health Behavior/Discharge Planning: Goal: Ability to manage health-related needs will improve Outcome: Adequate for Discharge   Problem: Clinical Measurements: Goal: Ability to maintain clinical measurements within normal limits will improve Outcome: Adequate for Discharge Goal: Will remain free from infection Outcome: Adequate for Discharge Goal: Diagnostic test results will improve Outcome: Adequate for Discharge Goal: Respiratory complications will improve Outcome: Adequate for Discharge Goal: Cardiovascular complication will be avoided Outcome: Adequate for Discharge   Problem: Activity: Goal: Risk for activity intolerance will decrease Outcome: Adequate for Discharge   Problem: Elimination: Goal: Will not experience complications related to bowel motility Outcome: Adequate for Discharge Goal: Will not experience complications related to urinary retention Outcome: Adequate for Discharge   Problem: Safety: Goal: Ability to remain free from injury will improve Outcome: Adequate for Discharge

## 2019-12-03 NOTE — Progress Notes (Signed)
Thomas Mccarty.  D/C'd to residential hospice per MD order.  Discussed with the patient family and all questions fully answered. Report was called in and given to [place] at the hospice home, all questions asked by [facility] answered by this nurse. [name] verbalized  understanding of all transfer information given to her.           An After Visit Summary was printed and given to [facility]    Patient transfered via stretcher , and D/C to residential hospice via ambulance at 1148.[name] [name] 12/03/2019 11:11 AM

## 2019-12-03 NOTE — Progress Notes (Signed)
Pt with some wet lung sounds upon safety rounding. Paged Dr Tonie Griffith with request to hold NS and add scopalamine patch to manage secretions, and he is agreeable to place NS @ 75 on hold for now. I also relay that the patient is DNR/comfort measures and has been unable to tolerate PO intake, so we will need to make note to resume IV fluids as appropriate once lung sounds improve. Dr Tonie Griffith also requesting stat chest xray and 1 time dose of lasix IV.

## 2019-12-06 DEATH — deceased
